# Patient Record
Sex: Female | Born: 1975 | Race: White | Hispanic: No | Marital: Married | Smoking: Current every day smoker
Health system: Southern US, Community
[De-identification: ages and names within clinical notes are randomized; demographics above are authoritative.]

## PROBLEM LIST (undated history)

## (undated) HISTORY — PX: HERNIA REPAIR: SHX51

---

## 2016-01-18 DIAGNOSIS — J209 Acute bronchitis, unspecified: Secondary | ICD-10-CM | POA: Diagnosis not present

## 2016-10-12 DIAGNOSIS — J40 Bronchitis, not specified as acute or chronic: Secondary | ICD-10-CM | POA: Diagnosis not present

## 2016-10-12 DIAGNOSIS — Z6828 Body mass index (BMI) 28.0-28.9, adult: Secondary | ICD-10-CM | POA: Diagnosis not present

## 2016-10-12 DIAGNOSIS — Z1389 Encounter for screening for other disorder: Secondary | ICD-10-CM | POA: Diagnosis not present

## 2016-11-15 DIAGNOSIS — W19XXXA Unspecified fall, initial encounter: Secondary | ICD-10-CM | POA: Diagnosis not present

## 2016-11-15 DIAGNOSIS — S01112A Laceration without foreign body of left eyelid and periocular area, initial encounter: Secondary | ICD-10-CM | POA: Diagnosis not present

## 2016-11-15 DIAGNOSIS — S0990XA Unspecified injury of head, initial encounter: Secondary | ICD-10-CM | POA: Diagnosis not present

## 2016-11-15 DIAGNOSIS — W1830XA Fall on same level, unspecified, initial encounter: Secondary | ICD-10-CM | POA: Diagnosis not present

## 2016-11-15 DIAGNOSIS — R040 Epistaxis: Secondary | ICD-10-CM | POA: Diagnosis not present

## 2017-10-04 DIAGNOSIS — F419 Anxiety disorder, unspecified: Secondary | ICD-10-CM | POA: Diagnosis not present

## 2017-10-04 DIAGNOSIS — Z6829 Body mass index (BMI) 29.0-29.9, adult: Secondary | ICD-10-CM | POA: Diagnosis not present

## 2017-10-04 DIAGNOSIS — F329 Major depressive disorder, single episode, unspecified: Secondary | ICD-10-CM | POA: Diagnosis not present

## 2018-03-26 DIAGNOSIS — Z6829 Body mass index (BMI) 29.0-29.9, adult: Secondary | ICD-10-CM | POA: Diagnosis not present

## 2018-03-26 DIAGNOSIS — J40 Bronchitis, not specified as acute or chronic: Secondary | ICD-10-CM | POA: Diagnosis not present

## 2018-03-26 DIAGNOSIS — J329 Chronic sinusitis, unspecified: Secondary | ICD-10-CM | POA: Diagnosis not present

## 2019-01-20 DIAGNOSIS — Z1389 Encounter for screening for other disorder: Secondary | ICD-10-CM | POA: Diagnosis not present

## 2019-01-20 DIAGNOSIS — Z124 Encounter for screening for malignant neoplasm of cervix: Secondary | ICD-10-CM | POA: Diagnosis not present

## 2019-01-20 DIAGNOSIS — Z1151 Encounter for screening for human papillomavirus (HPV): Secondary | ICD-10-CM | POA: Diagnosis not present

## 2019-01-20 DIAGNOSIS — E669 Obesity, unspecified: Secondary | ICD-10-CM | POA: Diagnosis not present

## 2019-01-20 DIAGNOSIS — Z1231 Encounter for screening mammogram for malignant neoplasm of breast: Secondary | ICD-10-CM | POA: Diagnosis not present

## 2019-01-20 DIAGNOSIS — Z130 Encounter for screening for diseases of the blood and blood-forming organs and certain disorders involving the immune mechanism: Secondary | ICD-10-CM | POA: Diagnosis not present

## 2019-01-20 DIAGNOSIS — Z113 Encounter for screening for infections with a predominantly sexual mode of transmission: Secondary | ICD-10-CM | POA: Diagnosis not present

## 2019-01-20 DIAGNOSIS — Z01419 Encounter for gynecological examination (general) (routine) without abnormal findings: Secondary | ICD-10-CM | POA: Diagnosis not present

## 2019-07-02 DIAGNOSIS — J40 Bronchitis, not specified as acute or chronic: Secondary | ICD-10-CM | POA: Diagnosis not present

## 2019-07-02 DIAGNOSIS — Z20828 Contact with and (suspected) exposure to other viral communicable diseases: Secondary | ICD-10-CM | POA: Diagnosis not present

## 2019-07-02 DIAGNOSIS — J329 Chronic sinusitis, unspecified: Secondary | ICD-10-CM | POA: Diagnosis not present

## 2020-02-02 DIAGNOSIS — Z1389 Encounter for screening for other disorder: Secondary | ICD-10-CM | POA: Diagnosis not present

## 2020-02-02 DIAGNOSIS — E669 Obesity, unspecified: Secondary | ICD-10-CM | POA: Diagnosis not present

## 2020-02-02 DIAGNOSIS — Z01419 Encounter for gynecological examination (general) (routine) without abnormal findings: Secondary | ICD-10-CM | POA: Diagnosis not present

## 2020-02-02 DIAGNOSIS — Z130 Encounter for screening for diseases of the blood and blood-forming organs and certain disorders involving the immune mechanism: Secondary | ICD-10-CM | POA: Diagnosis not present

## 2020-02-02 DIAGNOSIS — N9089 Other specified noninflammatory disorders of vulva and perineum: Secondary | ICD-10-CM | POA: Diagnosis not present

## 2020-02-11 DIAGNOSIS — Z13220 Encounter for screening for lipoid disorders: Secondary | ICD-10-CM | POA: Diagnosis not present

## 2020-02-11 DIAGNOSIS — Z0000 Encounter for general adult medical examination without abnormal findings: Secondary | ICD-10-CM | POA: Diagnosis not present

## 2020-02-11 DIAGNOSIS — Z1331 Encounter for screening for depression: Secondary | ICD-10-CM | POA: Diagnosis not present

## 2020-02-11 DIAGNOSIS — Z131 Encounter for screening for diabetes mellitus: Secondary | ICD-10-CM | POA: Diagnosis not present

## 2020-02-11 DIAGNOSIS — Z6831 Body mass index (BMI) 31.0-31.9, adult: Secondary | ICD-10-CM | POA: Diagnosis not present

## 2020-05-20 DIAGNOSIS — J449 Chronic obstructive pulmonary disease, unspecified: Secondary | ICD-10-CM | POA: Diagnosis not present

## 2020-05-20 DIAGNOSIS — Z20828 Contact with and (suspected) exposure to other viral communicable diseases: Secondary | ICD-10-CM | POA: Diagnosis not present

## 2020-05-24 DIAGNOSIS — R0602 Shortness of breath: Secondary | ICD-10-CM | POA: Diagnosis not present

## 2020-10-06 DIAGNOSIS — R1032 Left lower quadrant pain: Secondary | ICD-10-CM | POA: Diagnosis not present

## 2020-10-06 DIAGNOSIS — N939 Abnormal uterine and vaginal bleeding, unspecified: Secondary | ICD-10-CM | POA: Diagnosis not present

## 2020-10-06 DIAGNOSIS — R109 Unspecified abdominal pain: Secondary | ICD-10-CM | POA: Diagnosis not present

## 2020-10-06 DIAGNOSIS — T8389XA Other specified complication of genitourinary prosthetic devices, implants and grafts, initial encounter: Secondary | ICD-10-CM | POA: Diagnosis not present

## 2021-06-13 ENCOUNTER — Emergency Department (HOSPITAL_BASED_OUTPATIENT_CLINIC_OR_DEPARTMENT_OTHER): Payer: Self-pay

## 2021-06-13 ENCOUNTER — Encounter (HOSPITAL_BASED_OUTPATIENT_CLINIC_OR_DEPARTMENT_OTHER): Payer: Self-pay | Admitting: Urology

## 2021-06-13 ENCOUNTER — Inpatient Hospital Stay (HOSPITAL_BASED_OUTPATIENT_CLINIC_OR_DEPARTMENT_OTHER)
Admission: EM | Admit: 2021-06-13 | Discharge: 2021-06-22 | DRG: 853 | Disposition: A | Discharge status: Home / Self Care | Payer: Self-pay | Attending: Surgery | Admitting: Surgery

## 2021-06-13 ENCOUNTER — Other Ambulatory Visit: Payer: Self-pay

## 2021-06-13 DIAGNOSIS — X58XXXA Exposure to other specified factors, initial encounter: Secondary | ICD-10-CM | POA: Diagnosis present

## 2021-06-13 DIAGNOSIS — F17210 Nicotine dependence, cigarettes, uncomplicated: Secondary | ICD-10-CM | POA: Diagnosis present

## 2021-06-13 DIAGNOSIS — A419 Sepsis, unspecified organism: Principal | ICD-10-CM | POA: Diagnosis present

## 2021-06-13 DIAGNOSIS — K649 Unspecified hemorrhoids: Secondary | ICD-10-CM | POA: Diagnosis present

## 2021-06-13 DIAGNOSIS — K658 Other peritonitis: Secondary | ICD-10-CM | POA: Diagnosis present

## 2021-06-13 DIAGNOSIS — T184XXA Foreign body in colon, initial encounter: Secondary | ICD-10-CM | POA: Diagnosis present

## 2021-06-13 DIAGNOSIS — K621 Rectal polyp: Secondary | ICD-10-CM | POA: Diagnosis present

## 2021-06-13 DIAGNOSIS — K631 Perforation of intestine (nontraumatic): Secondary | ICD-10-CM | POA: Diagnosis present

## 2021-06-13 DIAGNOSIS — R030 Elevated blood-pressure reading, without diagnosis of hypertension: Secondary | ICD-10-CM | POA: Diagnosis not present

## 2021-06-13 DIAGNOSIS — Z20822 Contact with and (suspected) exposure to covid-19: Secondary | ICD-10-CM | POA: Diagnosis present

## 2021-06-13 DIAGNOSIS — K567 Ileus, unspecified: Secondary | ICD-10-CM | POA: Diagnosis not present

## 2021-06-13 DIAGNOSIS — Z933 Colostomy status: Secondary | ICD-10-CM

## 2021-06-13 LAB — URINALYSIS, ROUTINE W REFLEX MICROSCOPIC
Glucose, UA: NEGATIVE mg/dL
Ketones, ur: 80 mg/dL — AB
Leukocytes,Ua: NEGATIVE
Nitrite: NEGATIVE
Protein, ur: 100 mg/dL — AB
Specific Gravity, Urine: >1.03 — ABNORMAL HIGH (ref 1.005–1.030)
pH: 6 (ref 5.0–8.0)

## 2021-06-13 LAB — CBC WITH DIFFERENTIAL/PLATELET
Abs Immature Granulocytes: 0.13 10*3/uL — ABNORMAL HIGH (ref 0.00–0.07)
Basophils Absolute: 0.1 10*3/uL (ref 0.0–0.1)
Basophils Relative: 0 %
Eosinophils Absolute: 0 10*3/uL (ref 0.0–0.5)
Eosinophils Relative: 0 %
HCT: 47 % — ABNORMAL HIGH (ref 36.0–46.0)
Hemoglobin: 16.6 g/dL — ABNORMAL HIGH (ref 12.0–15.0)
Immature Granulocytes: 1 %
Lymphocytes Relative: 5 %
Lymphs Abs: 1.2 10*3/uL (ref 0.7–4.0)
MCH: 33.4 pg (ref 26.0–34.0)
MCHC: 35.3 g/dL (ref 30.0–36.0)
MCV: 94.6 fL (ref 80.0–100.0)
Monocytes Absolute: 1.3 10*3/uL — ABNORMAL HIGH (ref 0.1–1.0)
Monocytes Relative: 5 %
Neutro Abs: 20.9 10*3/uL — ABNORMAL HIGH (ref 1.7–7.7)
Neutrophils Relative %: 89 %
Platelets: 272 10*3/uL (ref 150–400)
RBC: 4.97 MIL/uL (ref 3.87–5.11)
RDW: 13.2 % (ref 11.5–15.5)
WBC: 23.6 10*3/uL — ABNORMAL HIGH (ref 4.0–10.5)
nRBC: 0 % (ref 0.0–0.2)

## 2021-06-13 LAB — COMPREHENSIVE METABOLIC PANEL
ALT: 13 U/L (ref 0–44)
AST: 16 U/L (ref 15–41)
Albumin: 3.6 g/dL (ref 3.5–5.0)
Alkaline Phosphatase: 118 U/L (ref 38–126)
Anion gap: 16 — ABNORMAL HIGH (ref 5–15)
BUN: 16 mg/dL (ref 6–20)
CO2: 23 mmol/L (ref 22–32)
Calcium: 9.2 mg/dL (ref 8.9–10.3)
Chloride: 92 mmol/L — ABNORMAL LOW (ref 98–111)
Creatinine, Ser: 0.79 mg/dL (ref 0.44–1.00)
GFR, Estimated: >60 mL/min (ref >60)
Glucose, Bld: 121 mg/dL — ABNORMAL HIGH (ref 70–99)
Potassium: 3 mmol/L — ABNORMAL LOW (ref 3.5–5.1)
Sodium: 131 mmol/L — ABNORMAL LOW (ref 135–145)
Total Bilirubin: 1.2 mg/dL (ref 0.3–1.2)
Total Protein: 8.4 g/dL — ABNORMAL HIGH (ref 6.5–8.1)

## 2021-06-13 LAB — PROTIME-INR
INR: 1.1 (ref 0.8–1.2)
Prothrombin Time: 13.9 seconds (ref 11.4–15.2)

## 2021-06-13 LAB — RESP PANEL BY RT-PCR (FLU A&B, COVID) ARPGX2
Influenza A by PCR: NEGATIVE
Influenza B by PCR: NEGATIVE
SARS Coronavirus 2 by RT PCR: NEGATIVE

## 2021-06-13 LAB — URINALYSIS, MICROSCOPIC (REFLEX)

## 2021-06-13 LAB — LACTIC ACID, PLASMA: Lactic Acid, Venous: 1.5 mmol/L (ref 0.5–1.9)

## 2021-06-13 LAB — APTT: aPTT: 25 seconds (ref 24–36)

## 2021-06-13 LAB — PREGNANCY, URINE: Preg Test, Ur: NEGATIVE

## 2021-06-13 MED ORDER — ONDANSETRON HCL 4 MG/2ML IJ SOLN: 4.0000 mg | Freq: Four times a day (QID) | INTRAMUSCULAR | Status: DC | PRN

## 2021-06-13 MED ORDER — PIPERACILLIN-TAZOBACTAM 3.375 G IVPB
3.3750 g | Freq: Three times a day (TID) | INTRAVENOUS | Status: DC
  Administered 2021-06-14 (×2): 3.375 g via INTRAVENOUS
  Filled 2021-06-13 (×4): qty 50

## 2021-06-13 MED ORDER — ACETAMINOPHEN 500 MG PO TABS
1000.0000 mg | ORAL_TABLET | Freq: Once | ORAL | Status: AC
  Administered 2021-06-13: 1000 mg via ORAL
  Filled 2021-06-13: qty 2

## 2021-06-13 MED ORDER — LACTATED RINGERS IV BOLUS (SEPSIS)
1000.0000 mL | Freq: Once | INTRAVENOUS | Status: AC
  Administered 2021-06-13: 1000 mL via INTRAVENOUS

## 2021-06-13 MED ORDER — IOHEXOL 300 MG/ML  SOLN
100.0000 mL | Freq: Once | INTRAMUSCULAR | Status: AC | PRN
  Administered 2021-06-13: 100 mL via INTRAVENOUS

## 2021-06-13 MED ORDER — METRONIDAZOLE 500 MG/100ML IV SOLN
500.0000 mg | Freq: Once | INTRAVENOUS | Status: AC
  Administered 2021-06-13: 500 mg via INTRAVENOUS
  Filled 2021-06-13: qty 100

## 2021-06-13 MED ORDER — ACETAMINOPHEN 325 MG PO TABS
650.0000 mg | ORAL_TABLET | Freq: Four times a day (QID) | ORAL | Status: DC | PRN
  Administered 2021-06-13: 650 mg via ORAL
  Filled 2021-06-13: qty 2

## 2021-06-13 MED ORDER — SODIUM CHLORIDE 0.9 % IV SOLN
2.0000 g | Freq: Once | INTRAVENOUS | Status: AC
  Administered 2021-06-13: 2 g via INTRAVENOUS
  Filled 2021-06-13: qty 20

## 2021-06-13 MED ORDER — POTASSIUM CHLORIDE IN NACL 20-0.9 MEQ/L-% IV SOLN
INTRAVENOUS | Status: DC
  Filled 2021-06-13 (×3): qty 1000

## 2021-06-13 MED ORDER — PIPERACILLIN-TAZOBACTAM 3.375 G IVPB: 3.3750 g | Freq: Three times a day (TID) | INTRAVENOUS | Status: DC

## 2021-06-13 MED ORDER — HYDROMORPHONE HCL 1 MG/ML IJ SOLN: 1.0000 mg | INTRAMUSCULAR | Status: DC | PRN

## 2021-06-13 MED ORDER — ENOXAPARIN SODIUM 40 MG/0.4ML IJ SOSY: 40.0000 mg | PREFILLED_SYRINGE | INTRAMUSCULAR | Status: DC

## 2021-06-13 MED ORDER — ONDANSETRON 4 MG PO TBDP: 4.0000 mg | ORAL_TABLET | Freq: Four times a day (QID) | ORAL | Status: DC | PRN

## 2021-06-13 NOTE — ED Provider Notes (Signed)
[facility]ERGENC[facility] DEPARTMENT Provider Note   CSN: [id number] Arrival date & time: 06/13/21  1646     History  Chief Complaint  Patient presents with   Abdominal Pain    Misty Peck is a 46 y.o. female.  Patient here with right lower abdominal pain with dark urine/dysuria, chills, body aches, fever.  She has been having some lower abdominal pain for the last several days but fever and chills since yesterday.  Noticed that urine was darker.  She denies any vaginal discharge.  She has had some nausea but no vomiting.  She has had bilateral hernia repair as well as C-section in the past.  Does not take any blood thinners.  The history is provided by the patient.  Abdominal Pain Pain location:  RLQ Pain quality: aching   Pain radiates to:  Does not radiate Pain severity:  Moderate Onset quality:  Gradual Duration:  5 days Timing:  Constant Progression:  Worsening Chronicity:  New Context: previous surgery   Relieved by:  Nothing Worsened by:  Nothing Ineffective treatments:  None tried Associated symptoms: chills, dysuria, fever and nausea   Risk factors: multiple surgeries       Home Medications Prior to Admission medications   Not on File      Allergies    Patient has no known allergies.    Review of Systems   Review of Systems  Constitutional:  Positive for chills and fever.  Gastrointestinal:  Positive for abdominal pain and nausea.  Genitourinary:  Positive for dysuria.   Physical Exam Updated Vital Signs BP (!) 151/101    Pulse (!) 108    Temp 99.2 F (37.3 C) (Oral)    Resp 20    Ht 5' (1.524 m)    Wt 72.6 kg    SpO2 94%    BMI 31.25 kg/m  Physical Exam Vitals and nursing note reviewed.  Constitutional:      General: She is not in acute distress.    Appearance: She is well-developed. She is not ill-appearing.  HENT:     Head: Normocephalic and atraumatic.     Mouth/Throat:     Mouth: Mucous membranes are moist.  Eyes:      Extraocular Movements: Extraocular movements intact.     Conjunctiva/sclera: Conjunctivae normal.  Cardiovascular:     Rate and Rhythm: Regular rhythm. Tachycardia present.     Heart sounds: Normal heart sounds. No murmur heard. Pulmonary:     Effort: Pulmonary effort is normal. No respiratory distress.     Breath sounds: Normal breath sounds.  Abdominal:     Palpations: Abdomen is soft.     Tenderness: There is abdominal tenderness in the right lower quadrant.     Hernia: No hernia is present.  Musculoskeletal:        General: No swelling.     Cervical back: Neck supple.  Skin:    General: Skin is warm and dry.     Capillary Refill: Capillary refill takes less than 2 seconds.  Neurological:     General: No focal deficit present.     Mental Status: She is alert.  Psychiatric:        Mood and Affect: Mood normal.    ED Results / Procedures / Treatments   Labs (all labs ordered are listed, but only abnormal results are displayed) Labs Reviewed  URINALYSIS, ROUTINE W REFLEX MICROSCOPIC - Abnormal; Notable for the following components:      Result Value  Color, Urine AMBER (*)    APPearance CLOUDY (*)    Specific Gravity, Urine >1.030 (*)    Hgb urine dipstick SMALL (*)    Bilirubin Urine MODERATE (*)    Ketones, ur 80 (*)    Protein, ur 100 (*)    All other components within normal limits  COMPREHENSIVE METABOLIC PANEL - Abnormal; Notable for the following components:   Sodium 131 (*)    Potassium 3.0 (*)    Chloride 92 (*)    Glucose, Bld 121 (*)    Total Protein 8.4 (*)    Anion gap 16 (*)    All other components within normal limits  CBC WITH DIFFERENTIAL/PLATELET - Abnormal; Notable for the following components:   WBC 23.6 (*)    Hemoglobin 16.6 (*)    HCT 47.0 (*)    Neutro Abs 20.9 (*)    Monocytes Absolute 1.3 (*)    Abs Immature Granulocytes 0.13 (*)    All other components within normal limits  URINALYSIS, MICROSCOPIC (REFLEX) - Abnormal; Notable for the  following components:   Bacteria, UA MANY (*)    All other components within normal limits  RESP PANEL BY RT-PCR (FLU A&B, COVID) ARPGX2  CULTURE, BLOOD (ROUTINE X 2)  CULTURE, BLOOD (ROUTINE X 2)  URINE CULTURE  LACTIC ACID, PLASMA  PROTIME-INR  APTT  PREGNANCY, URINE  LACTIC ACID, PLASMA    EKG EKG Interpretation  Date/Time:  Monday June 13 2021 17:33:59 EST Ventricular Rate:  122 PR Interval:  156 QRS Duration: 94 QT Interval:  319 QTC Calculation: 455 R Axis:   -72 Text Interpretation: Sinus tachycardia LAE, consider biatrial enlargement Abnormal R-wave progression, late transition Left ventricular hypertrophy Inferior infarct, old Confirmed by Lennice Sites [phone]) on 06/13/2021 5:37:24 PM  Radiology CT ABDOMEN PELVIS W CONTRAST  Result Date: 06/13/2021 CLINICAL DATA:  Bilateral lower quadrant pain and fever. Patient reports right-sided pelvic pain since Friday. EXAM: CT ABDOMEN AND PELVIS WITH CONTRAST TECHNIQUE: Multidetector CT imaging of the abdomen and pelvis was performed using the standard protocol following bolus administration of intravenous contrast. RADIATION DOSE REDUCTION: This exam was performed according to the departmental dose-optimization program which includes automated exposure control, adjustment of the mA and/or kV according to patient size and/or use of iterative reconstruction technique. CONTRAST:  185mL OMNIPAQUE IOHEXOL 300 MG/ML  SOLN COMPARISON:  None. FINDINGS: Lower chest: No acute airspace disease or pleural effusion. Hepatobiliary: No focal liver abnormality is seen. Possible but not definite gallstone. No pericholecystic fat stranding or inflammation. No biliary dilatation. Pancreas: No ductal dilatation or inflammation. Spleen: Normal in size without focal abnormality. Small cleft posteriorly. Adrenals/Urinary Tract: No adrenal nodule. No hydronephrosis or perinephric edema. Homogeneous renal enhancement with symmetric excretion on delayed phase  imaging. No renal calculi or focal renal lesion. Urinary bladder is near completely empty. Stomach/Bowel: There is a linear metallic density in the sigmoid colon. This spans approximately 2.9 cm and is hooked at the end. There is associated sigmoid bowel perforation with mottled extraluminal air, colonic wall thickening, and fat stranding. Small amount of non organized trace fluid. This is in the pelvis just to the left of midline. Free air tracks into the left upper quadrant. No drainable fluid collection. The appendix is visualized and is normal. Small volume of colonic stool. Occasional fluid-filled loops of small bowel, mildly edematous in the pelvis, likely reactive. No small bowel obstruction. Stomach is decompressed. Vascular/Lymphatic: Aortic atherosclerosis. No aortic aneurysm. There is no portal venous or mesenteric  gas. Scattered prominent mesenteric lymph nodes, typically reactive. Few prominent inguinal nodes. Reproductive: T-shaped IUD in the uterus.  No adnexal mass. Other: Mild free air in the pelvis with small amount of non organized free fluid. Patchy soft tissue gas tracks into the left upper abdomen. No drainable collection. Musculoskeletal: There are no acute or suspicious osseous abnormalities. IMPRESSION: 1. Linear metallic density in the sigmoid colon spanning approximately 2.9 cm and hooked at the end, consistent with foreign body. There is associated sigmoid bowel perforation with mottled extraluminal air, colonic wall thickening, none organized trace fluid and fat stranding. Free air tracks into the left upper quadrant of the abdomen. No drainable fluid collection. 2. Possible but not definite gallstone. No evidence of acute cholecystitis. Aortic Atherosclerosis (ICD10-I70.0). Critical Value/emergent results were called by telephone at the time of interpretation on 06/13/2021 at 6:31 pm to provider Flavius Repsher , who verbally acknowledged these results. Electronically Signed   By: Narda Rutherford M.D.   On: 06/13/2021 18:32   DG Chest Port 1 View  Result Date: 06/13/2021 CLINICAL DATA:  Right-sided pelvic pain dark urine EXAM: PORTABLE CHEST 1 VIEW COMPARISON:  None. FINDINGS: The heart size and mediastinal contours are within normal limits. Both lungs are clear. The visualized skeletal structures are unremarkable. IMPRESSION: No active disease. Electronically Signed   By: Jasmine Pang M.D.   On: 06/13/2021 17:37    Procedures .Critical Care Performed by: Virgina Norfolk, DO Authorized by: Virgina Norfolk, DO   Critical care provider statement:    Critical care time (minutes):  40   Critical care was necessary to treat or prevent imminent or life-threatening deterioration of the following conditions:  Sepsis   Critical care was time spent personally by me on the following activities:  Blood draw for specimens, development of treatment plan with patient or surrogate, discussions with consultants, evaluation of patient's response to treatment, examination of patient, obtaining history from patient or surrogate, ordering and performing treatments and interventions, ordering and review of laboratory studies, ordering and review of radiographic studies, pulse oximetry and review of old charts   Care discussed with: admitting provider      Medications Ordered in ED Medications  lactated ringers bolus 1,000 mL (1,000 mLs Intravenous New Bag/Given 06/13/21 1731)  cefTRIAXone (ROCEPHIN) 2 g in sodium chloride 0.9 % 100 mL IVPB (0 g Intravenous Stopped 06/13/21 1834)  metroNIDAZOLE (FLAGYL) IVPB 500 mg (500 mg Intravenous New Bag/Given 06/13/21 1733)  acetaminophen (TYLENOL) tablet 1,000 mg (1,000 mg Oral Given 06/13/21 1725)  iohexol (OMNIPAQUE) 300 MG/ML solution 100 mL (100 mLs Intravenous Contrast Given 06/13/21 1757)    ED Course/ Medical Decision Making/ A&P                           Medical Decision Making Amount and/or Complexity of Data Reviewed Labs: ordered. Radiology:  ordered. ECG/medicine tests: ordered.  Risk OTC drugs. Prescription drug management. Decision regarding hospitalization.   Latoi D Aseltine is here with abdominal pain.  Patient arrives febrile and tachycardic.  She has been having mostly right lower abdominal pain on and off for the last several days.  Now with some fevers and chills since last night.  Has felt nauseous at times.  Urine has been darker.  Denies history of kidney stones.  Comorbidities that are important to think about her are that patient has had abdominal surgery including C-section and hernia repair in the past.  She denies any diarrhea.  She is tender mostly in the right lower quadrant on exam and may be suprapubic area.  She has maybe had some left flank pain also.  Denies any obvious vaginal discharge or bleeding.  Has an IUD.  Differential diagnosis includes appendicitis versus bowel obstruction versus cystitis versus kidney stones versus possibly ovarian pathology less PID.  Given the patient's tachycardia and fever, sepsis work-up has been initiated.  We will give a dose IV Flagyl and Rocephin to broadly cover for intra-abdominal process/UTI.  We will get CBC, CMP, lipase, lactic acid, urinalysis, chest x-ray, CT scan abdomen and pelvis.  Will give IV fluids, IV antibiotics, will give Tylenol.    645 pm lab work upon my interpretation and review show leukocytosis of 23.6.  Lactic acid however is normal.  No other significant electrolyte abnormality or kidney injury.  COVID and flu test are negative.  Radiologist called me on the phone concerning CT abdomen and pelvis and we talked on the phone.  States that there is a linear metallic density within the sigmoid colon about 2.9 cm in height at the end consistent with a foreign body.  There is associated sigmoid bowel perforation with mottled extraluminal air and colonic wall thickening but no organized fluid or fat stranding.  Free air tracks into the left upper quadrant of the  abdomen.  Upon reevaluation of the patient she does admit that she lost her partial denture and overall suspect that she swallowed this accidentally causing this infectious process today.  We will talk with general surgery.  She is hemodynamically stable.  Tachycardia has improved.  Fever has improved.  650 pm talked with Dr. Ninfa Linden with general surgery on the phone who states to transfer patient to [facility] long ED for evaluation by him.  I have made Dr. Melina Copa with the emergency medicine team aware of transfer.  This chart was dictated using voice recognition software.  Despite best efforts to proofread,  errors can occur which can change the documentation meaning.         Final Clinical Impression(s) / ED Diagnoses Final diagnoses:  Sepsis, due to unspecified organism, unspecified whether acute organ dysfunction present [facility])  Perforated sigmoid colon ([place])  Foreign body in colon, initial encounter    Rx / DC Orders ED Discharge Orders     None         Lennice Sites, DO 06/13/21 1849

## 2021-06-13 NOTE — ED Provider Notes (Signed)
Pt transferred from [facility] for further eval by gen surg. CT scan with findings concerning for bowel perforation.   Physical Exam  BP (!) 147/95    Pulse (!) 102    Temp 99.2 F (37.3 C) (Oral)    Resp (!) 25    Ht 5' (1.524 m)    Wt 72.6 kg    SpO2 97%    BMI 31.25 kg/m   Physical Exam Vitals and nursing note reviewed.  Constitutional:      Appearance: She is not ill-appearing.  HENT:     Head: Normocephalic and atraumatic.  Eyes:     Conjunctiva/sclera: Conjunctivae normal.  Cardiovascular:     Rate and Rhythm: Normal rate and regular rhythm.  Pulmonary:     Effort: Pulmonary effort is normal.     Breath sounds: Normal breath sounds.  Skin:    General: Skin is warm and dry.     Coloration: Skin is not jaundiced.  Neurological:     Mental Status: She is alert.    Procedures  Procedures  ED Course / MDM    Medical Decision Making Amount and/or Complexity of Data Reviewed Labs: ordered. Radiology: ordered. ECG/medicine tests: ordered. Discussion of management or test interpretation with external provider(s): Dr. Magnus Ivan consulted. Aware pt is here in the ED. Will admit.   Risk OTC drugs. Prescription drug management. Decision regarding hospitalization.          Tanda Rockers, PA-C 06/13/21 2128    Terrilee Files, MD 06/14/21 [phone]

## 2021-06-13 NOTE — ED Triage Notes (Signed)
Intermittent Right sided pelvic pain since Friday Reports dark urine Denies Nausea/vomiting Took laxative for constipation, normal BM

## 2021-06-13 NOTE — ED Notes (Signed)
Patient transported to CT 

## 2021-06-13 NOTE — ED Notes (Signed)
[facility] at bedside 

## 2021-06-13 NOTE — ED Notes (Signed)
Patient returned from CT

## 2021-06-13 NOTE — ED Notes (Signed)
[facility] states they will arrive in ~5 minutes.

## 2021-06-13 NOTE — H&P (Signed)
CC: abdominal pain  HPI: Misty Peck is an 46 y.o. female who is transferred from [place].  She presented with a several day history of lower abdominal pain which she described as intermittent and cramping.  She also had some chills and fevers.  She had taken laxatives and had a bowel movement on Saturday.  She was found to have an elevated white blood count on laboratory data.  She then underwent a CT scan of the abdomen pelvis showing a foreign body in the sigmoid colon with focal perforation.  After discussing the CAT scan findings with the patient, she admitted that she likely swallowed her partial dentures over a week ago.  She just started having abdominal discomfort on Friday or Saturday.  She reports her pain again is intermittent and is now currently a 2 out of 10.  She reports having had dark urine.  History reviewed. No pertinent past medical history.  Past Surgical History:  Procedure Laterality Date   CESAREAN SECTION     HERNIA REPAIR      History reviewed. No pertinent family history.  Social:  reports that she has been smoking cigarettes. She has been smoking an average of 1 pack per day. She has never been exposed to tobacco smoke. She has never used smokeless tobacco. She reports current alcohol use. She reports that she does not use drugs.  Allergies: No Known Allergies  Medications: I have reviewed the patient's current medications.  Results for orders placed or performed during the hospital encounter of 06/13/21 (from the past 48 hour(s))  Resp Panel by RT-PCR (Flu A&B, Covid) Nasopharyngeal Swab     Status: None   Collection Time: 06/13/21  4:59 PM   Specimen: Nasopharyngeal Swab; Nasopharyngeal(NP) swabs in vial transport medium  Result Value Ref Range   SARS Coronavirus 2 by RT PCR NEGATIVE NEGATIVE    Comment: (NOTE) SARS-CoV-2 target nucleic acids are NOT DETECTED.  The SARS-CoV-2 RNA is generally detectable in upper respiratory specimens  during the acute phase of infection. The lowest concentration of SARS-CoV-2 viral copies this assay can detect is 138 copies/mL. A negative result does not preclude SARS-Cov-2 infection and should not be used as the sole basis for treatment or other patient management decisions. A negative result may occur with  improper specimen collection/handling, submission of specimen other than nasopharyngeal swab, presence of viral mutation(s) within the areas targeted by this assay, and inadequate number of viral copies(<138 copies/mL). A negative result must be combined with clinical observations, patient history, and epidemiological information. The expected result is Negative.  Fact Sheet for Patients:  EntrepreneurPulse.com.au  Fact Sheet for Healthcare Providers:  IncredibleEmployment.be  This test is no t yet approved or cleared by the [place] FDA and  has been authorized for detection and/or diagnosis of SARS-CoV-2 by FDA under an Emergency Use Authorization (EUA). This EUA will remain  in effect (meaning this test can be used) for the duration of the COVID-19 declaration under Section 564(b)(1) of the Act, 21 U.S.C.section 360bbb-3(b)(1), unless the authorization is terminated  or revoked sooner.       Influenza A by PCR NEGATIVE NEGATIVE   Influenza B by PCR NEGATIVE NEGATIVE    Comment: (NOTE) The Xpert Xpress SARS-CoV-2/FLU/RSV plus assay is intended as an aid in the diagnosis of influenza from Nasopharyngeal swab specimens and should not be used as a sole basis for treatment. Nasal washings and aspirates are unacceptable for Xpert Xpress SARS-CoV-2/FLU/RSV testing.  Fact  Sheet for Patients: EntrepreneurPulse.com.au  Fact Sheet for Healthcare Providers: IncredibleEmployment.be  This test is not yet approved or cleared by the [place] FDA and has been authorized for detection and/or diagnosis  of SARS-CoV-2 by FDA under an Emergency Use Authorization (EUA). This EUA will remain in effect (meaning this test can be used) for the duration of the COVID-19 declaration under Section 564(b)(1) of the Act, 21 U.S.C. section 360bbb-3(b)(1), unless the authorization is terminated or revoked.  Performed at [facility], [address] [place], [place] [address]   Urinalysis, Routine w reflex microscopic Urine, Clean Catch     Status: Abnormal   Collection Time: 06/13/21  4:59 PM  Result Value Ref Range   Color, Urine AMBER (A) YELLOW    Comment: BIOCHEMICALS MAY BE AFFECTED BY COLOR   APPearance CLOUDY (A) CLEAR   Specific Gravity, Urine >1.030 (H) 1.005 - 1.030   pH 6.0 5.0 - 8.0   Glucose, UA NEGATIVE NEGATIVE mg/dL   Hgb urine dipstick SMALL (A) NEGATIVE   Bilirubin Urine MODERATE (A) NEGATIVE   Ketones, ur 80 (A) NEGATIVE mg/dL   Protein, ur 100 (A) NEGATIVE mg/dL   Nitrite NEGATIVE NEGATIVE   Leukocytes,Ua NEGATIVE NEGATIVE    Comment: Performed at [facility], [address] [place], [place] [address]  Urinalysis, Microscopic (reflex)     Status: Abnormal   Collection Time: 06/13/21  4:59 PM  Result Value Ref Range   RBC / HPF 0-5 0 - 5 RBC/hpf   WBC, UA 0-5 0 - 5 WBC/hpf   Bacteria, UA MANY (A) NONE SEEN   Squamous Epithelial / LPF 0-5 0 - 5   Mucus PRESENT     Comment: Performed at [facility], [address] [place], [place] [address]  Lactic acid, plasma     Status: None   Collection Time: 06/13/21  5:00 PM  Result Value Ref Range   Lactic Acid, Venous 1.5 0.5 - 1.9 mmol/L    Comment: Performed at [facility], [address] [place], [place] [address]  Comprehensive metabolic panel     Status: Abnormal   Collection Time: 06/13/21  5:00 PM  Result Value Ref Range   Sodium 131 (L) 135 - 145 mmol/L   Potassium 3.0 (L) 3.5 - 5.1 mmol/L   Chloride 92 (L) 98 - 111 mmol/L   CO2 23 22 - 32 mmol/L   Glucose, Bld 121  (H) 70 - 99 mg/dL    Comment: Glucose reference range applies only to samples taken after fasting for at least 8 hours.   BUN 16 6 - 20 mg/dL   Creatinine, Ser 0.79 0.44 - 1.00 mg/dL   Calcium 9.2 8.9 - 10.3 mg/dL   Total Protein 8.4 (H) 6.5 - 8.1 g/dL   Albumin 3.6 3.5 - 5.0 g/dL   AST 16 15 - 41 U/L   ALT 13 0 - 44 U/L   Alkaline Phosphatase 118 38 - 126 U/L   Total Bilirubin 1.2 0.3 - 1.2 mg/dL   GFR, Estimated >60 >60 mL/min    Comment: (NOTE) Calculated using the CKD-EPI Creatinine Equation (2021)    Anion gap 16 (H) 5 - 15    Comment: Performed at [facility], [address] [place], [place] [address]  CBC WITH DIFFERENTIAL     Status: Abnormal   Collection Time: 06/13/21  5:00 PM  Result Value Ref Range  WBC 23.6 (H) 4.0 - 10.5 K/uL   RBC 4.97 3.87 - 5.11 MIL/uL   Hemoglobin 16.6 (H) 12.0 - 15.0 g/dL   HCT 47.0 (H) 36.0 - 46.0 %   MCV 94.6 80.0 - 100.0 fL   MCH 33.4 26.0 - 34.0 pg   MCHC 35.3 30.0 - 36.0 g/dL   RDW 13.2 11.5 - 15.5 %   Platelets 272 150 - 400 K/uL   nRBC 0.0 0.0 - 0.2 %   Neutrophils Relative % 89 %   Neutro Abs 20.9 (H) 1.7 - 7.7 K/uL   Lymphocytes Relative 5 %   Lymphs Abs 1.2 0.7 - 4.0 K/uL   Monocytes Relative 5 %   Monocytes Absolute 1.3 (H) 0.1 - 1.0 K/uL   Eosinophils Relative 0 %   Eosinophils Absolute 0.0 0.0 - 0.5 K/uL   Basophils Relative 0 %   Basophils Absolute 0.1 0.0 - 0.1 K/uL   Immature Granulocytes 1 %   Abs Immature Granulocytes 0.13 (H) 0.00 - 0.07 K/uL    Comment: Performed at [facility], [address] [place], [place] [address]  Protime-INR     Status: None   Collection Time: 06/13/21  5:00 PM  Result Value Ref Range   Prothrombin Time 13.9 11.4 - 15.2 seconds   INR 1.1 0.8 - 1.2    Comment: (NOTE) INR goal varies based on device and disease states. Performed at [facility], [address] [place], [place] [address]   APTT     Status: None   Collection Time: 06/13/21  5:00 PM   Result Value Ref Range   aPTT 25 24 - 36 seconds    Comment: Performed at [facility], [address] [place], [place] [address]  Pregnancy, urine     Status: None   Collection Time: 06/13/21  5:01 PM  Result Value Ref Range   Preg Test, Ur NEGATIVE NEGATIVE    Comment:        THE SENSITIVITY OF THIS METHODOLOGY IS >20 mIU/mL. Performed at [facility], [address] [place], [place] [address]     CT ABDOMEN PELVIS W CONTRAST  Result Date: 06/13/2021 CLINICAL DATA:  Bilateral lower quadrant pain and fever. Patient reports right-sided pelvic pain since Friday. EXAM: CT ABDOMEN AND PELVIS WITH CONTRAST TECHNIQUE: Multidetector CT imaging of the abdomen and pelvis was performed using the standard protocol following bolus administration of intravenous contrast. RADIATION DOSE REDUCTION: This exam was performed according to the departmental dose-optimization program which includes automated exposure control, adjustment of the mA and/or kV according to patient size and/or use of iterative reconstruction technique. CONTRAST:  161mL OMNIPAQUE IOHEXOL 300 MG/ML  SOLN COMPARISON:  None. FINDINGS: Lower chest: No acute airspace disease or pleural effusion. Hepatobiliary: No focal liver abnormality is seen. Possible but not definite gallstone. No pericholecystic fat stranding or inflammation. No biliary dilatation. Pancreas: No ductal dilatation or inflammation. Spleen: Normal in size without focal abnormality. Small cleft posteriorly. Adrenals/Urinary Tract: No adrenal nodule. No hydronephrosis or perinephric edema. Homogeneous renal enhancement with symmetric excretion on delayed phase imaging. No renal calculi or focal renal lesion. Urinary bladder is near completely empty. Stomach/Bowel: There is a linear metallic density in the sigmoid colon. This spans approximately 2.9 cm and is hooked at the end. There is associated sigmoid bowel perforation with mottled extraluminal air,  colonic wall thickening, and fat stranding. Small amount of non organized trace fluid. This  is in the pelvis just to the left of midline. Free air tracks into the left upper quadrant. No drainable fluid collection. The appendix is visualized and is normal. Small volume of colonic stool. Occasional fluid-filled loops of small bowel, mildly edematous in the pelvis, likely reactive. No small bowel obstruction. Stomach is decompressed. Vascular/Lymphatic: Aortic atherosclerosis. No aortic aneurysm. There is no portal venous or mesenteric gas. Scattered prominent mesenteric lymph nodes, typically reactive. Few prominent inguinal nodes. Reproductive: T-shaped IUD in the uterus.  No adnexal mass. Other: Mild free air in the pelvis with small amount of non organized free fluid. Patchy soft tissue gas tracks into the left upper abdomen. No drainable collection. Musculoskeletal: There are no acute or suspicious osseous abnormalities. IMPRESSION: 1. Linear metallic density in the sigmoid colon spanning approximately 2.9 cm and hooked at the end, consistent with foreign body. There is associated sigmoid bowel perforation with mottled extraluminal air, colonic wall thickening, none organized trace fluid and fat stranding. Free air tracks into the left upper quadrant of the abdomen. No drainable fluid collection. 2. Possible but not definite gallstone. No evidence of acute cholecystitis. Aortic Atherosclerosis (ICD10-I70.0). Critical Value/emergent results were called by telephone at the time of interpretation on 06/13/2021 at 6:31 pm to provider ADAM CURATOLO , who verbally acknowledged these results. Electronically Signed   By: Keith Rake M.D.   On: 06/13/2021 18:32   DG Chest Port 1 View  Result Date: 06/13/2021 CLINICAL DATA:  Right-sided pelvic pain dark urine EXAM: PORTABLE CHEST 1 VIEW COMPARISON:  None. FINDINGS: The heart size and mediastinal contours are within normal limits. Both lungs are clear. The  visualized skeletal structures are unremarkable. IMPRESSION: No active disease. Electronically Signed   By: Donavan Foil M.D.   On: 06/13/2021 17:37    ROS - all of the below systems have been reviewed with the patient and positives are indicated with bold text General: chills, fever or night sweats Eyes: blurry vision or double vision ENT: epistaxis or sore throat Allergy/Immunology: itchy/watery eyes or nasal congestion Hematologic/Lymphatic: bleeding problems, blood clots or swollen lymph nodes Endocrine: temperature intolerance or unexpected weight changes Breast: new or changing breast lumps or nipple discharge Resp: cough, shortness of breath, or wheezing CV: chest pain or dyspnea on exertion GI: as per HPI GU: dysuria, trouble voiding, or hematuria MSK: joint pain or joint stiffness Neuro: TIA or stroke symptoms Derm: pruritus and skin lesion changes Psych: anxiety and depression  PE Blood pressure (!) 150/101, pulse (!) 101, temperature 99.2 F (37.3 C), temperature source Oral, resp. rate (!) 26, height 5' (1.524 m), weight 72.6 kg, SpO2 96 %. Constitutional: NAD; conversant; no deformities Eyes: Moist conjunctiva; no lid lag; anicteric; PERRL Neck: Trachea midline; no thyromegaly Lungs: Normal respiratory effort; no tactile fremitus CV: RRR; no palpable thrills; no pitting edema GI: Abd soft and nondistended with only focal tenderness and guarding which is moderate in the left lower quadrant.  She does not have frank peritonitis; no palpable hepatosplenomegaly MSK: Normal range of motion of extremities; no clubbing/cyanosis Psychiatric: Appropriate affect; alert and oriented x3 Lymphatic: No palpable cervical or axillary lymphadenopathy  Results for orders placed or performed during the hospital encounter of 06/13/21 (from the past 48 hour(s))  Resp Panel by RT-PCR (Flu A&B, Covid) Nasopharyngeal Swab     Status: None   Collection Time: 06/13/21  4:59 PM   Specimen:  Nasopharyngeal Swab; Nasopharyngeal(NP) swabs in vial transport medium  Result Value Ref Range   SARS Coronavirus  2 by RT PCR NEGATIVE NEGATIVE    Comment: (NOTE) SARS-CoV-2 target nucleic acids are NOT DETECTED.  The SARS-CoV-2 RNA is generally detectable in upper respiratory specimens during the acute phase of infection. The lowest concentration of SARS-CoV-2 viral copies this assay can detect is 138 copies/mL. A negative result does not preclude SARS-Cov-2 infection and should not be used as the sole basis for treatment or other patient management decisions. A negative result may occur with  improper specimen collection/handling, submission of specimen other than nasopharyngeal swab, presence of viral mutation(s) within the areas targeted by this assay, and inadequate number of viral copies(<138 copies/mL). A negative result must be combined with clinical observations, patient history, and epidemiological information. The expected result is Negative.  Fact Sheet for Patients:  EntrepreneurPulse.com.au  Fact Sheet for Healthcare Providers:  IncredibleEmployment.be  This test is no t yet approved or cleared by the [place] FDA and  has been authorized for detection and/or diagnosis of SARS-CoV-2 by FDA under an Emergency Use Authorization (EUA). This EUA will remain  in effect (meaning this test can be used) for the duration of the COVID-19 declaration under Section 564(b)(1) of the Act, 21 U.S.C.section 360bbb-3(b)(1), unless the authorization is terminated  or revoked sooner.       Influenza A by PCR NEGATIVE NEGATIVE   Influenza B by PCR NEGATIVE NEGATIVE    Comment: (NOTE) The Xpert Xpress SARS-CoV-2/FLU/RSV plus assay is intended as an aid in the diagnosis of influenza from Nasopharyngeal swab specimens and should not be used as a sole basis for treatment. Nasal washings and aspirates are unacceptable for Xpert Xpress  SARS-CoV-2/FLU/RSV testing.  Fact Sheet for Patients: EntrepreneurPulse.com.au  Fact Sheet for Healthcare Providers: IncredibleEmployment.be  This test is not yet approved or cleared by the [place] FDA and has been authorized for detection and/or diagnosis of SARS-CoV-2 by FDA under an Emergency Use Authorization (EUA). This EUA will remain in effect (meaning this test can be used) for the duration of the COVID-19 declaration under Section 564(b)(1) of the Act, 21 U.S.C. section 360bbb-3(b)(1), unless the authorization is terminated or revoked.  Performed at [facility], [address] [place], [place] [address]   Urinalysis, Routine w reflex microscopic Urine, Clean Catch     Status: Abnormal   Collection Time: 06/13/21  4:59 PM  Result Value Ref Range   Color, Urine AMBER (A) YELLOW    Comment: BIOCHEMICALS MAY BE AFFECTED BY COLOR   APPearance CLOUDY (A) CLEAR   Specific Gravity, Urine >1.030 (H) 1.005 - 1.030   pH 6.0 5.0 - 8.0   Glucose, UA NEGATIVE NEGATIVE mg/dL   Hgb urine dipstick SMALL (A) NEGATIVE   Bilirubin Urine MODERATE (A) NEGATIVE   Ketones, ur 80 (A) NEGATIVE mg/dL   Protein, ur 100 (A) NEGATIVE mg/dL   Nitrite NEGATIVE NEGATIVE   Leukocytes,Ua NEGATIVE NEGATIVE    Comment: Performed at [facility], [address] [place], [place] [address]  Urinalysis, Microscopic (reflex)     Status: Abnormal   Collection Time: 06/13/21  4:59 PM  Result Value Ref Range   RBC / HPF 0-5 0 - 5 RBC/hpf   WBC, UA 0-5 0 - 5 WBC/hpf   Bacteria, UA MANY (A) NONE SEEN   Squamous Epithelial / LPF 0-5 0 - 5   Mucus PRESENT     Comment: Performed at [facility], [address] [place], [place] [address]  Lactic acid, plasma  Status: None   Collection Time: 06/13/21  5:00 PM  Result Value Ref Range   Lactic Acid, Venous 1.5 0.5 - 1.9 mmol/L    Comment: Performed at [facility], [address] [place], [place] [address]  Comprehensive metabolic panel     Status: Abnormal   Collection Time: 06/13/21  5:00 PM  Result Value Ref Range   Sodium 131 (L) 135 - 145 mmol/L   Potassium 3.0 (L) 3.5 - 5.1 mmol/L   Chloride 92 (L) 98 - 111 mmol/L   CO2 23 22 - 32 mmol/L   Glucose, Bld 121 (H) 70 - 99 mg/dL    Comment: Glucose reference range applies only to samples taken after fasting for at least 8 hours.   BUN 16 6 - 20 mg/dL   Creatinine, Ser 0.79 0.44 - 1.00 mg/dL   Calcium 9.2 8.9 - 10.3 mg/dL   Total Protein 8.4 (H) 6.5 - 8.1 g/dL   Albumin 3.6 3.5 - 5.0 g/dL   AST 16 15 - 41 U/L   ALT 13 0 - 44 U/L   Alkaline Phosphatase 118 38 - 126 U/L   Total Bilirubin 1.2 0.3 - 1.2 mg/dL   GFR, Estimated >60 >60 mL/min    Comment: (NOTE) Calculated using the CKD-EPI Creatinine Equation (2021)    Anion gap 16 (H) 5 - 15    Comment: Performed at [facility], [address] [place], [place] [address]  CBC WITH DIFFERENTIAL     Status: Abnormal   Collection Time: 06/13/21  5:00 PM  Result Value Ref Range   WBC 23.6 (H) 4.0 - 10.5 K/uL   RBC 4.97 3.87 - 5.11 MIL/uL   Hemoglobin 16.6 (H) 12.0 - 15.0 g/dL   HCT 47.0 (H) 36.0 - 46.0 %   MCV 94.6 80.0 - 100.0 fL   MCH 33.4 26.0 - 34.0 pg   MCHC 35.3 30.0 - 36.0 g/dL   RDW 13.2 11.5 - 15.5 %   Platelets 272 150 - 400 K/uL   nRBC 0.0 0.0 - 0.2 %   Neutrophils Relative % 89 %   Neutro Abs 20.9 (H) 1.7 - 7.7 K/uL   Lymphocytes Relative 5 %   Lymphs Abs 1.2 0.7 - 4.0 K/uL   Monocytes Relative 5 %   Monocytes Absolute 1.3 (H) 0.1 - 1.0 K/uL   Eosinophils Relative 0 %   Eosinophils Absolute 0.0 0.0 - 0.5 K/uL   Basophils Relative 0 %   Basophils Absolute 0.1 0.0 - 0.1 K/uL   Immature Granulocytes 1 %   Abs Immature Granulocytes 0.13 (H) 0.00 - 0.07 K/uL    Comment: Performed at [facility], [address] [place], [place] [address]  Protime-INR     Status: None   Collection Time: 06/13/21  5:00 PM   Result Value Ref Range   Prothrombin Time 13.9 11.4 - 15.2 seconds   INR 1.1 0.8 - 1.2    Comment: (NOTE) INR goal varies based on device and disease states. Performed at [facility], [address] [place], [place] [address]   APTT     Status: None   Collection Time: 06/13/21  5:00 PM  Result Value Ref Range   aPTT 25 24 - 36 seconds    Comment: Performed at [facility], [address] [place], [place] [address]  Pregnancy, urine     Status: None   Collection  Time: 06/13/21  5:01 PM  Result Value Ref Range   Preg Test, Ur NEGATIVE NEGATIVE    Comment:        THE SENSITIVITY OF THIS METHODOLOGY IS >20 mIU/mL. Performed at [facility], [address] [place], [place] [address]     CT ABDOMEN PELVIS W CONTRAST  Result Date: 06/13/2021 CLINICAL DATA:  Bilateral lower quadrant pain and fever. Patient reports right-sided pelvic pain since Friday. EXAM: CT ABDOMEN AND PELVIS WITH CONTRAST TECHNIQUE: Multidetector CT imaging of the abdomen and pelvis was performed using the standard protocol following bolus administration of intravenous contrast. RADIATION DOSE REDUCTION: This exam was performed according to the departmental dose-optimization program which includes automated exposure control, adjustment of the mA and/or kV according to patient size and/or use of iterative reconstruction technique. CONTRAST:  OMNIPAQUE IOHEXOL 300 MG/ML  SOLN COMPARISON:  None. FINDINGS: Lower chest: No acute airspace disease or pleural effusion. Hepatobiliary: No focal liver abnormality is seen. Possible but not definite gallstone. No pericholecystic fat stranding or inflammation. No biliary dilatation. Pancreas: No ductal dilatation or inflammation. Spleen: Normal in size without focal abnormality. Small cleft posteriorly. Adrenals/Urinary Tract: No adrenal nodule. No hydronephrosis or perinephric edema. Homogeneous renal enhancement with symmetric excretion on  delayed phase imaging. No renal calculi or focal renal lesion. Urinary bladder is near completely empty. Stomach/Bowel: There is a linear metallic density in the sigmoid colon. This spans approximately 2.9 cm and is hooked at the end. There is associated sigmoid bowel perforation with mottled extraluminal air, colonic wall thickening, and fat stranding. Small amount of non organized trace fluid. This is in the pelvis just to the left of midline. Free air tracks into the left upper quadrant. No drainable fluid collection. The appendix is visualized and is normal. Small volume of colonic stool. Occasional fluid-filled loops of small bowel, mildly edematous in the pelvis, likely reactive. No small bowel obstruction. Stomach is decompressed. Vascular/Lymphatic: Aortic atherosclerosis. No aortic aneurysm. There is no portal venous or mesenteric gas. Scattered prominent mesenteric lymph nodes, typically reactive. Few prominent inguinal nodes. Reproductive: T-shaped IUD in the uterus.  No adnexal mass. Other: Mild free air in the pelvis with small amount of non organized free fluid. Patchy soft tissue gas tracks into the left upper abdomen. No drainable collection. Musculoskeletal: There are no acute or suspicious osseous abnormalities. IMPRESSION: 1. Linear metallic density in the sigmoid colon spanning approximately 2.9 cm and hooked at the end, consistent with foreign body. There is associated sigmoid bowel perforation with mottled extraluminal air, colonic wall thickening, none organized trace fluid and fat stranding. Free air tracks into the left upper quadrant of the abdomen. No drainable fluid collection. 2. Possible but not definite gallstone. No evidence of acute cholecystitis. Aortic Atherosclerosis (ICD10-I70.0). Critical Value/emergent results were called by telephone at the time of interpretation on 06/13/2021 at 6:31 pm to provider ADAM CURATOLO , who verbally acknowledged these results. Electronically Signed    By: Narda Rutherford M.D.   On: 06/13/2021 18:32   DG Chest Port 1 View  Result Date: 06/13/2021 CLINICAL DATA:  Right-sided pelvic pain dark urine EXAM: PORTABLE CHEST 1 VIEW COMPARISON:  None. FINDINGS: The heart size and mediastinal contours are within normal limits. Both lungs are clear. The visualized skeletal structures are unremarkable. IMPRESSION: No active disease. Electronically Signed   By: Jasmine Pang M.D.   On: 06/13/2021 17:37    I have personally reviewed the relevant labs and CT scan  A/P:  Focal perforation of the sigmoid colon from a foreign body.  She surprisingly is only focally tender and does not have diffuse peritonitis.  I suspect the foreign body has been there for some time and is now caused a focal perforation.  There is currently no fluid in the area.  I discussed the situation with the patient and her husband.  I also discussed this with my partner, Dr. Hassell Done, who is also on-call.  She would like to try to avoid a colostomy.  We discussed going to the operating room tonight which was necessitate a resection and a colostomy versus an attempt at conservative management with IV rehydration and IV antibiotics.  We will then discuss this with gastroenterology to determine whether sigmoidoscopy can be performed potentially even in the operating room to remove the foreign body if possible in the attempts of trying to avoid a colostomy.  The patient would like to try conservative management.  She understands that if she acutely worsens that we would proceed to the operating room this evening for emergent surgery and a colostomy or even in the morning.  We will notify gastroenterology in the morning for possible consultation as well.  This required high complex medical decision making 80making  I spent a total of 75 minutes in both face-to-face and non-face-to-face activities, excluding procedures performed, for this H&P on the date of this encounter.  Coralie Keens, MD  [facility], [facility]

## 2021-06-13 NOTE — ED Notes (Signed)
XR at bedside

## 2021-06-13 NOTE — Sepsis Progress Note (Signed)
eLink monitoring code sepsis.  

## 2021-06-14 ENCOUNTER — Other Ambulatory Visit: Payer: Self-pay

## 2021-06-14 ENCOUNTER — Encounter (HOSPITAL_COMMUNITY): Admission: EM | Disposition: A | Discharge status: Home / Self Care | Payer: Self-pay | Source: Home / Self Care

## 2021-06-14 ENCOUNTER — Inpatient Hospital Stay (HOSPITAL_COMMUNITY): Payer: Self-pay | Admitting: Certified Registered Nurse Anesthetist

## 2021-06-14 ENCOUNTER — Encounter (HOSPITAL_COMMUNITY): Payer: Self-pay | Admitting: Surgery

## 2021-06-14 HISTORY — PX: LAPAROTOMY: SHX154

## 2021-06-14 HISTORY — PX: FLEXIBLE SIGMOIDOSCOPY: SHX5431

## 2021-06-14 LAB — BASIC METABOLIC PANEL
Anion gap: 13 (ref 5–15)
BUN: 17 mg/dL (ref 6–20)
CO2: 22 mmol/L (ref 22–32)
Calcium: 8.5 mg/dL — ABNORMAL LOW (ref 8.9–10.3)
Chloride: 99 mmol/L (ref 98–111)
Creatinine, Ser: 0.61 mg/dL (ref 0.44–1.00)
GFR, Estimated: >60 mL/min (ref >60)
Glucose, Bld: 99 mg/dL (ref 70–99)
Potassium: 3.1 mmol/L — ABNORMAL LOW (ref 3.5–5.1)
Sodium: 134 mmol/L — ABNORMAL LOW (ref 135–145)

## 2021-06-14 LAB — CBC
HCT: 43.7 % (ref 36.0–46.0)
Hemoglobin: 14.8 g/dL (ref 12.0–15.0)
MCH: 33.2 pg (ref 26.0–34.0)
MCHC: 33.9 g/dL (ref 30.0–36.0)
MCV: 98 fL (ref 80.0–100.0)
Platelets: 250 10*3/uL (ref 150–400)
RBC: 4.46 MIL/uL (ref 3.87–5.11)
RDW: 13.4 % (ref 11.5–15.5)
WBC: 18.5 10*3/uL — ABNORMAL HIGH (ref 4.0–10.5)
nRBC: 0 % (ref 0.0–0.2)

## 2021-06-14 LAB — HIV ANTIBODY (ROUTINE TESTING W REFLEX): HIV Screen 4th Generation wRfx: NONREACTIVE

## 2021-06-14 SURGERY — SIGMOIDOSCOPY, FLEXIBLE
Anesthesia: Monitor Anesthesia Care

## 2021-06-14 SURGERY — LAPAROTOMY, EXPLORATORY
Anesthesia: Monitor Anesthesia Care

## 2021-06-14 MED ORDER — PANTOPRAZOLE SODIUM 40 MG IV SOLR
40.0000 mg | Freq: Every day | INTRAVENOUS | Status: DC
  Administered 2021-06-14 – 2021-06-19 (×6): 40 mg via INTRAVENOUS
  Filled 2021-06-14 (×6): qty 40

## 2021-06-14 MED ORDER — KCL IN DEXTROSE-NACL 20-5-0.45 MEQ/L-%-% IV SOLN
INTRAVENOUS | Status: DC
  Filled 2021-06-14 (×10): qty 1000

## 2021-06-14 MED ORDER — FENTANYL CITRATE PF 50 MCG/ML IJ SOSY
PREFILLED_SYRINGE | INTRAMUSCULAR | Status: AC
  Filled 2021-06-14: qty 3

## 2021-06-14 MED ORDER — ROCURONIUM BROMIDE 100 MG/10ML IV SOLN
INTRAVENOUS | Status: DC | PRN
  Administered 2021-06-14: 50 mg via INTRAVENOUS
  Administered 2021-06-14: 20 mg via INTRAVENOUS

## 2021-06-14 MED ORDER — FENTANYL CITRATE (PF) 250 MCG/5ML IJ SOLN
INTRAMUSCULAR | Status: AC
  Filled 2021-06-14: qty 5

## 2021-06-14 MED ORDER — ONDANSETRON HCL 4 MG/2ML IJ SOLN
INTRAMUSCULAR | Status: AC
  Filled 2021-06-14: qty 2

## 2021-06-14 MED ORDER — PROMETHAZINE HCL 25 MG/ML IJ SOLN: 6.2500 mg | INTRAMUSCULAR | Status: DC | PRN

## 2021-06-14 MED ORDER — ESMOLOL HCL 100 MG/10ML IV SOLN
INTRAVENOUS | Status: AC
  Filled 2021-06-14: qty 10

## 2021-06-14 MED ORDER — SODIUM CHLORIDE 0.9 % IR SOLN
Status: DC | PRN
  Administered 2021-06-14: 1000 mL

## 2021-06-14 MED ORDER — ENOXAPARIN SODIUM 40 MG/0.4ML IJ SOSY
40.0000 mg | PREFILLED_SYRINGE | INTRAMUSCULAR | Status: DC
  Administered 2021-06-15 – 2021-06-22 (×8): 40 mg via SUBCUTANEOUS
  Filled 2021-06-14 (×8): qty 0.4

## 2021-06-14 MED ORDER — PHENYLEPHRINE 40 MCG/ML (10ML) SYRINGE FOR IV PUSH (FOR BLOOD PRESSURE SUPPORT)
PREFILLED_SYRINGE | INTRAVENOUS | Status: DC | PRN
  Administered 2021-06-14: 80 ug via INTRAVENOUS

## 2021-06-14 MED ORDER — ONDANSETRON HCL 4 MG/2ML IJ SOLN: 4.0000 mg | Freq: Four times a day (QID) | INTRAMUSCULAR | Status: DC | PRN

## 2021-06-14 MED ORDER — HYDROMORPHONE HCL 1 MG/ML IJ SOLN
1.0000 mg | INTRAMUSCULAR | Status: DC | PRN
  Administered 2021-06-14 – 2021-06-20 (×31): 1 mg via INTRAVENOUS
  Filled 2021-06-14 (×33): qty 1

## 2021-06-14 MED ORDER — PROPOFOL 10 MG/ML IV BOLUS
INTRAVENOUS | Status: AC
  Filled 2021-06-14: qty 20

## 2021-06-14 MED ORDER — ESMOLOL HCL 100 MG/10ML IV SOLN
INTRAVENOUS | Status: DC | PRN
  Administered 2021-06-14: 30 mg via INTRAVENOUS
  Administered 2021-06-14 (×2): 20 mg via INTRAVENOUS

## 2021-06-14 MED ORDER — ONDANSETRON 4 MG PO TBDP: 4.0000 mg | ORAL_TABLET | Freq: Four times a day (QID) | ORAL | Status: DC | PRN

## 2021-06-14 MED ORDER — OXYCODONE HCL 5 MG/5ML PO SOLN: 5.0000 mg | Freq: Once | ORAL | Status: DC | PRN

## 2021-06-14 MED ORDER — FENTANYL CITRATE PF 50 MCG/ML IJ SOSY
25.0000 ug | PREFILLED_SYRINGE | INTRAMUSCULAR | Status: DC | PRN
  Administered 2021-06-14 (×3): 50 ug via INTRAVENOUS

## 2021-06-14 MED ORDER — FENTANYL CITRATE (PF) 100 MCG/2ML IJ SOLN
INTRAMUSCULAR | Status: AC
  Filled 2021-06-14: qty 2

## 2021-06-14 MED ORDER — LABETALOL HCL 5 MG/ML IV SOLN
INTRAVENOUS | Status: AC
  Filled 2021-06-14: qty 4

## 2021-06-14 MED ORDER — PHENYLEPHRINE 40 MCG/ML (10ML) SYRINGE FOR IV PUSH (FOR BLOOD PRESSURE SUPPORT)
PREFILLED_SYRINGE | INTRAVENOUS | Status: AC
  Filled 2021-06-14: qty 10

## 2021-06-14 MED ORDER — LIDOCAINE HCL (PF) 2 % IJ SOLN
INTRAMUSCULAR | Status: AC
  Filled 2021-06-14: qty 5

## 2021-06-14 MED ORDER — ACETAMINOPHEN 10 MG/ML IV SOLN: 1000.0000 mg | Freq: Once | INTRAVENOUS | Status: DC | PRN

## 2021-06-14 MED ORDER — ACETAMINOPHEN 325 MG PO TABS: 650.0000 mg | ORAL_TABLET | Freq: Four times a day (QID) | ORAL | Status: DC | PRN

## 2021-06-14 MED ORDER — PROPOFOL 1000 MG/100ML IV EMUL
INTRAVENOUS | Status: AC
  Filled 2021-06-14: qty 100

## 2021-06-14 MED ORDER — OXYCODONE HCL 5 MG PO TABS: 5.0000 mg | ORAL_TABLET | Freq: Once | ORAL | Status: DC | PRN

## 2021-06-14 MED ORDER — AMISULPRIDE (ANTIEMETIC) 5 MG/2ML IV SOLN: 10.0000 mg | Freq: Once | INTRAVENOUS | Status: DC | PRN

## 2021-06-14 MED ORDER — ACETAMINOPHEN 650 MG RE SUPP: 650.0000 mg | Freq: Four times a day (QID) | RECTAL | Status: DC | PRN

## 2021-06-14 MED ORDER — SUGAMMADEX SODIUM 200 MG/2ML IV SOLN
INTRAVENOUS | Status: DC | PRN
  Administered 2021-06-14: 200 mg via INTRAVENOUS

## 2021-06-14 MED ORDER — LIDOCAINE 2% (20 MG/ML) 5 ML SYRINGE
INTRAMUSCULAR | Status: DC | PRN
  Administered 2021-06-14: 80 mg via INTRAVENOUS

## 2021-06-14 MED ORDER — PIPERACILLIN-TAZOBACTAM 3.375 G IVPB
3.3750 g | Freq: Three times a day (TID) | INTRAVENOUS | Status: DC
  Administered 2021-06-14 – 2021-06-20 (×17): 3.375 g via INTRAVENOUS
  Filled 2021-06-14 (×19): qty 50

## 2021-06-14 MED ORDER — ONDANSETRON HCL 4 MG/2ML IJ SOLN
INTRAMUSCULAR | Status: DC | PRN
Start: 2021-06-14 — End: 2021-06-14
  Administered 2021-06-14: 4 mg via INTRAVENOUS

## 2021-06-14 MED ORDER — LABETALOL HCL 5 MG/ML IV SOLN
INTRAVENOUS | Status: DC | PRN
  Administered 2021-06-14 (×4): 5 mg via INTRAVENOUS

## 2021-06-14 MED ORDER — DEXAMETHASONE SODIUM PHOSPHATE 10 MG/ML IJ SOLN
INTRAMUSCULAR | Status: AC
  Filled 2021-06-14: qty 1

## 2021-06-14 MED ORDER — PROPOFOL 500 MG/50ML IV EMUL
INTRAVENOUS | Status: DC | PRN
  Administered 2021-06-14: 100 ug/kg/min via INTRAVENOUS

## 2021-06-14 MED ORDER — PROPOFOL 10 MG/ML IV BOLUS
INTRAVENOUS | Status: DC | PRN
  Administered 2021-06-14 (×3): 40 mg via INTRAVENOUS
  Administered 2021-06-14: 50 mg via INTRAVENOUS
  Administered 2021-06-14: 30 mg via INTRAVENOUS

## 2021-06-14 MED ORDER — ROCURONIUM BROMIDE 10 MG/ML (PF) SYRINGE
PREFILLED_SYRINGE | INTRAVENOUS | Status: AC
  Filled 2021-06-14: qty 10

## 2021-06-14 MED ORDER — LACTATED RINGERS IV SOLN: INTRAVENOUS | Status: DC

## 2021-06-14 MED ORDER — FENTANYL CITRATE (PF) 250 MCG/5ML IJ SOLN
INTRAMUSCULAR | Status: DC | PRN
  Administered 2021-06-14 (×5): 50 ug via INTRAVENOUS
  Administered 2021-06-14: 150 ug via INTRAVENOUS
  Administered 2021-06-14: 50 ug via INTRAVENOUS

## 2021-06-14 SURGICAL SUPPLY — 55 items
APL PRP STRL LF DISP 70% ISPRP (MISCELLANEOUS) ×1
APL SWBSTK 6 STRL LF DISP (MISCELLANEOUS)
APPLICATOR COTTON TIP 6 STRL (MISCELLANEOUS) ×1 IMPLANT
APPLICATOR COTTON TIP 6IN STRL (MISCELLANEOUS) IMPLANT
BAG COUNTER SPONGE SURGICOUNT (BAG) IMPLANT
BAG SPNG CNTER NS LX DISP (BAG)
BLADE EXTENDED COATED 6.5IN (ELECTRODE) ×1 IMPLANT
BLADE HEX COATED 2.75 (ELECTRODE) ×2 IMPLANT
BNDG GAUZE ELAST 4 BULKY (GAUZE/BANDAGES/DRESSINGS) ×1 IMPLANT
CHLORAPREP W/TINT 26 (MISCELLANEOUS) ×2 IMPLANT
COVER MAYO STAND STRL (DRAPES) IMPLANT
COVER SURGICAL LIGHT HANDLE (MISCELLANEOUS) ×2 IMPLANT
DRAPE LAPAROSCOPIC ABDOMINAL (DRAPES) ×2 IMPLANT
DRAPE WARM FLUID 44X44 (DRAPES) ×1 IMPLANT
DRSG PAD ABDOMINAL 8X10 ST (GAUZE/BANDAGES/DRESSINGS) ×2 IMPLANT
ELECT REM PT RETURN 15FT ADLT (MISCELLANEOUS) ×2 IMPLANT
GAUZE SPONGE 4X4 12PLY STRL (GAUZE/BANDAGES/DRESSINGS) ×2 IMPLANT
GLOVE SURG ORTHO LTX SZ8 (GLOVE) ×2 IMPLANT
GLOVE SURG SYN 7.5  E (GLOVE) ×1
GLOVE SURG SYN 7.5 E (GLOVE) ×1 IMPLANT
GLOVE SURG SYN 7.5 PF PI (GLOVE) ×1 IMPLANT
GLOVE SURG UNDER POLY LF SZ7 (GLOVE) ×2 IMPLANT
GOWN STRL REUS W/TWL LRG LVL3 (GOWN DISPOSABLE) ×2 IMPLANT
GOWN STRL REUS W/TWL XL LVL3 (GOWN DISPOSABLE) ×5 IMPLANT
HANDLE SUCTION POOLE (INSTRUMENTS) IMPLANT
KIT BASIN OR (CUSTOM PROCEDURE TRAY) ×2 IMPLANT
KIT TURNOVER KIT A (KITS) IMPLANT
LIGASURE IMPACT 36 18CM CVD LR (INSTRUMENTS) ×1 IMPLANT
NS IRRIG 1000ML POUR BTL (IV SOLUTION) ×2 IMPLANT
PACK GENERAL/GYN (CUSTOM PROCEDURE TRAY) ×2 IMPLANT
PREP POVIDONE IODINE SPRAY 2OZ (MISCELLANEOUS) ×1 IMPLANT
SOL PREP POV-IOD 4OZ 10% (MISCELLANEOUS) ×1 IMPLANT
SPONGE T-LAP 18X18 ~~LOC~~+RFID (SPONGE) ×3 IMPLANT
STAPLER GUN LINEAR PROX 60 (STAPLE) ×1 IMPLANT
STAPLER PROXIMATE 75MM BLUE (STAPLE) ×1 IMPLANT
STAPLER VISISTAT 35W (STAPLE) ×2 IMPLANT
SUCTION POOLE HANDLE (INSTRUMENTS)
SUT NOV 1 T60/GS (SUTURE) IMPLANT
SUT NOVA NAB GS-21 1 T12 (SUTURE) ×2 IMPLANT
SUT PROLENE 2 0 SH DA (SUTURE) ×1 IMPLANT
SUT SILK 2 0 (SUTURE)
SUT SILK 2 0 SH CR/8 (SUTURE) ×1 IMPLANT
SUT SILK 2-0 18XBRD TIE 12 (SUTURE) IMPLANT
SUT SILK 3 0 (SUTURE)
SUT SILK 3 0 SH CR/8 (SUTURE) IMPLANT
SUT SILK 3-0 18XBRD TIE 12 (SUTURE) IMPLANT
SUT VIC AB 2-0 SH 18 (SUTURE) ×1 IMPLANT
SUT VICRYL 2 0 18  UND BR (SUTURE)
SUT VICRYL 2 0 18 UND BR (SUTURE) IMPLANT
TAPE CLOTH SURG 6X10 WHT LF (GAUZE/BANDAGES/DRESSINGS) ×1 IMPLANT
TOWEL OR 17X26 10 PK STRL BLUE (TOWEL DISPOSABLE) ×4 IMPLANT
TRAY FOLEY MTR SLVR 16FR STAT (SET/KITS/TRAYS/PACK) IMPLANT
TUBING CONNECTING 10 (TUBING) ×1 IMPLANT
WATER STERILE IRR 1000ML POUR (IV SOLUTION) ×1 IMPLANT
YANKAUER SUCT BULB TIP NO VENT (SUCTIONS) IMPLANT

## 2021-06-14 NOTE — Progress Notes (Signed)
Assessment & Plan: Colonic perforation secondary to foreign body  Plan sigmoidoscopy this afternoon by Dr. Marca Ancona in OR  If significant perforation, will require laparotomy and resection with colostomy  IV Zosyn  NPO  Discussed with patient and family at bedside.  Will try endoscopic retrieval of partial denture prosthesis by Dr. Marca Ancona.  Will do in OR and be prepared for laparotomy.  If successful retrieval without significant perforation, will manage like diverticular disease (plan per Dr. Magnus Ivan).  Patient and family understand and agree to proceed later today.        Darnell Level, MD       [facility]       Office: [phone]   Chief Complaint: Abdominal pain, colonic perforation secondary to foreign body  Subjective: Patient appears comfortable, family at bedside  Objective: Vital signs in last 24 hours: Temp:  [98.3 F (36.8 C)-101.3 F (38.5 C)] 98.5 F (36.9 C) (01/24 0644) Pulse Rate:  [98-140] 104 (01/24 0644) Resp:  [12-29] 18 (01/24 0644) BP: (136-190)/(87-112) 152/87 (01/24 0644) SpO2:  [89 %-99 %] 96 % (01/24 0644) Weight:  [72.6 kg] 72.6 kg (01/23 1654) Last BM Date: 06/14/21  Intake/Output from previous day: 01/23 0701 - 01/24 0700 In: 2021.3 [I.V.:921.3; IV Piggyback:1100] Out: -  Intake/Output this shift: No intake/output data recorded.  Physical Exam: HEENT - sclerae clear, mucous membranes moist Neck - soft Abdomen - soft, obese; mild tenderness to palpation LLQ, no mass, no guarding Ext - no edema, non-tender Neuro - alert & oriented, no focal deficits  Lab Results:  Recent Labs    06/13/21 1700 06/14/21 0317  WBC 23.6* 18.5*  HGB 16.6* 14.8  HCT 47.0* 43.7  PLT 272 250   BMET Recent Labs    06/13/21 1700 06/14/21 0317  NA 131* 134*  K 3.0* 3.1*  CL 92* 99  CO2 23 22  GLUCOSE 121* 99  BUN 16 17  CREATININE 0.79 0.61  CALCIUM 9.2 8.5*   PT/INR Recent Labs    06/13/21 1700  LABPROT 13.9  INR 1.1    Comprehensive Metabolic Panel:    Component Value Date/Time   NA 134 (L) 06/14/2021 0317   NA 131 (L) 06/13/2021 1700   K 3.1 (L) 06/14/2021 0317   K 3.0 (L) 06/13/2021 1700   CL 99 06/14/2021 0317   CL 92 (L) 06/13/2021 1700   CO2 22 06/14/2021 0317   CO2 23 06/13/2021 1700   BUN 17 06/14/2021 0317   BUN 16 06/13/2021 1700   CREATININE 0.61 06/14/2021 0317   CREATININE 0.79 06/13/2021 1700   GLUCOSE 99 06/14/2021 0317   GLUCOSE 121 (H) 06/13/2021 1700   CALCIUM 8.5 (L) 06/14/2021 0317   CALCIUM 9.2 06/13/2021 1700   AST 16 06/13/2021 1700   ALT 13 06/13/2021 1700   ALKPHOS 118 06/13/2021 1700   BILITOT 1.2 06/13/2021 1700   PROT 8.4 (H) 06/13/2021 1700   ALBUMIN 3.6 06/13/2021 1700    Studies/Results: CT ABDOMEN PELVIS W CONTRAST  Result Date: 06/13/2021 CLINICAL DATA:  Bilateral lower quadrant pain and fever. Patient reports right-sided pelvic pain since Friday. EXAM: CT ABDOMEN AND PELVIS WITH CONTRAST TECHNIQUE: Multidetector CT imaging of the abdomen and pelvis was performed using the standard protocol following bolus administration of intravenous contrast. RADIATION DOSE REDUCTION: This exam was performed according to the departmental dose-optimization program which includes automated exposure control, adjustment of the mA and/or kV according to patient size and/or use of iterative reconstruction technique.  CONTRAST:  167mL OMNIPAQUE IOHEXOL 300 MG/ML  SOLN COMPARISON:  None. FINDINGS: Lower chest: No acute airspace disease or pleural effusion. Hepatobiliary: No focal liver abnormality is seen. Possible but not definite gallstone. No pericholecystic fat stranding or inflammation. No biliary dilatation. Pancreas: No ductal dilatation or inflammation. Spleen: Normal in size without focal abnormality. Small cleft posteriorly. Adrenals/Urinary Tract: No adrenal nodule. No hydronephrosis or perinephric edema. Homogeneous renal enhancement with symmetric excretion on delayed phase  imaging. No renal calculi or focal renal lesion. Urinary bladder is near completely empty. Stomach/Bowel: There is a linear metallic density in the sigmoid colon. This spans approximately 2.9 cm and is hooked at the end. There is associated sigmoid bowel perforation with mottled extraluminal air, colonic wall thickening, and fat stranding. Small amount of non organized trace fluid. This is in the pelvis just to the left of midline. Free air tracks into the left upper quadrant. No drainable fluid collection. The appendix is visualized and is normal. Small volume of colonic stool. Occasional fluid-filled loops of small bowel, mildly edematous in the pelvis, likely reactive. No small bowel obstruction. Stomach is decompressed. Vascular/Lymphatic: Aortic atherosclerosis. No aortic aneurysm. There is no portal venous or mesenteric gas. Scattered prominent mesenteric lymph nodes, typically reactive. Few prominent inguinal nodes. Reproductive: T-shaped IUD in the uterus.  No adnexal mass. Other: Mild free air in the pelvis with small amount of non organized free fluid. Patchy soft tissue gas tracks into the left upper abdomen. No drainable collection. Musculoskeletal: There are no acute or suspicious osseous abnormalities. IMPRESSION: 1. Linear metallic density in the sigmoid colon spanning approximately 2.9 cm and hooked at the end, consistent with foreign body. There is associated sigmoid bowel perforation with mottled extraluminal air, colonic wall thickening, none organized trace fluid and fat stranding. Free air tracks into the left upper quadrant of the abdomen. No drainable fluid collection. 2. Possible but not definite gallstone. No evidence of acute cholecystitis. Aortic Atherosclerosis (ICD10-I70.0). Critical Value/emergent results were called by telephone at the time of interpretation on 06/13/2021 at 6:31 pm to provider ADAM CURATOLO , who verbally acknowledged these results. Electronically Signed   By: Keith Rake M.D.   On: 06/13/2021 18:32   DG Chest Port 1 View  Result Date: 06/13/2021 CLINICAL DATA:  Right-sided pelvic pain dark urine EXAM: PORTABLE CHEST 1 VIEW COMPARISON:  None. FINDINGS: The heart size and mediastinal contours are within normal limits. Both lungs are clear. The visualized skeletal structures are unremarkable. IMPRESSION: No active disease. Electronically Signed   By: Donavan Foil M.D.   On: 06/13/2021 17:37      Armandina Gemma 06/14/2021   Patient ID: Misty Peck, female   DOB: 04-18-1976, 46 y.o.   MRN: [id number]

## 2021-06-14 NOTE — Plan of Care (Signed)
  Problem: Pain Managment: Goal: General experience of comfort will improve Outcome: Progressing   Problem: Safety: Goal: Ability to remain free from injury will improve Outcome: Progressing   

## 2021-06-14 NOTE — Op Note (Addendum)
[facility] Patient Name: Misty DupontCrystal Peck Procedure Date: 06/14/2021 MRN: [id number] Attending MD: Misty SalenArya Garielle Peck , MD Date of Birth: April 27, 1976 CSN: [id number] Age: [age] Admit Type: Inpatient Procedure:                Flexible Sigmoidoscopy Indications:              Abnormal CT of the GI tract, perforated sigmoid                            colon due to retained foreign body (swallowed                            dentures), attempt to remove foreign body in the OR                            in presence of general surgeon(standy if immediate                            assistance needed) Providers:                Misty SalenArya Renai Lopata, MD, Misty ConnorsMichael Peck, Misty ScarletJasmine Peck,                            Technician, Misty CeraJosh Jarvela, CRNA Referring MD:             [facility], General Surgery Medicines:                Monitored Anesthesia Care Complications:            No immediate complications. Estimated Blood Loss:     Estimated blood loss: none. Procedure:                Pre-Anesthesia Assessment:                           - Prior to the procedure, a History and Physical                            was performed, and patient medications and                            allergies were reviewed. The patient's tolerance of                            previous anesthesia was also reviewed. The risks                            and benefits of the procedure and the sedation                            options and risks were discussed with the patient.                            All questions were answered, and informed consent  was obtained. Prior Anticoagulants: The patient has                            taken no previous anticoagulant or antiplatelet                            agents. ASA Grade Assessment: II - A patient with                            mild systemic disease. After reviewing the risks                            and benefits, the patient was deemed in                             satisfactory condition to undergo the procedure.                           After obtaining informed consent, the scope was                            passed under direct vision. The [id number]-H190 ([id number])                            Olympus endoscope was introduced through the anus                            and advanced to the 30 cm from the anal verge. The                            [id number]-[id number] ([id number]) Olympus colonoscope was                            introduced through the anus and advanced to the.                            The flexible sigmoidoscopy was accomplished without                            difficulty. The patient tolerated the procedure                            well. The quality of the bowel preparation was poor. Scope In: 3:22:58 PM Scope Out: 3:31:48 PM Total Procedure Duration: 0 hours 8 minutes 50 seconds  Findings:      A moderate amount of liquid and semi-liquid stool was found in the       rectum, in the recto-sigmoid colon and in the sigmoid colon, making       visualization difficult. Lavage of the area was performed, resulting in       clearance with fair visualization.      A localized area of severely thickened folds of the mucosa was found in       the sigmoid colon.      A part of  the retained foreign body was noted at 25 cm from insertion.       It seemed embedded within the colonic wall. An area of ulceration and       perforation was noted at the site of the foreign body.      Due to orientation of the foreign body(embedded within the colonic       wall), inability to visualize the entire foreign body, associated       perforation and ulceration, an attempt was not made to grasp the foreign       body.      A 7 mm polyp was found in the rectum. The polyp was sessile. Polypectomy       was not attempted.      Hemorrhoids were found on perianal exam. Impression:               Retained foreign body seemed embedded within                             colonic wall, associated with perforation.                           - Preparation of the colon was poor.                           - Stool in the rectum, in the recto-sigmoid colon                            and in the sigmoid colon.                           - Thickened folds of the mucosa in the sigmoid                            colon.                           - One 7 mm polyp in the rectum. Resection not                            attempted.                           - Hemorrhoids found on perianal exam.                           - No specimens collected. Moderate Sedation:      Patient did not receive moderate sedation for this procedure, but       instead received monitored anesthesia care. Recommendation:           - Dr.Gerkin from General Surgery will operate on                            the patient immediately for likely colonic                            resection.                           -  Perform a colonoscopy at appointment to be                            scheduled as an outpatient.                           This will likely need to be done in the next 2-3                            months after surgery, as she had a rectal polyp                            which was not removed and she has never had a full                            colonoscopy. Procedure Code(s):        --- Professional ---                           [phone], Sigmoidoscopy, flexible; diagnostic,                            including collection of specimen(s) by brushing or                            washing, when performed (separate procedure) Diagnosis Code(s):        --- Professional ---                           K63.89, Other specified diseases of intestine                           K62.1, Rectal polyp                           R93.3, Abnormal findings on diagnostic imaging of                            other parts of digestive tract CPT copyright 2019 [employer]. All rights  reserved. The codes documented in this report are preliminary and upon coder review may  be revised to meet current compliance requirements. Misty Salen, MD 06/14/2021 4:17:40 PM This report has been signed electronically. Number of Addenda: 0

## 2021-06-14 NOTE — Progress Notes (Signed)
Initial Nutrition Assessment  DOCUMENTATION CODES:  Not applicable  INTERVENTION:  Diet progression to regular as able after procedures this PM Add nutrition supplements to augment intake pending diet advancement Obtain NFPE as able to assess fat/muscle stores  NUTRITION DIAGNOSIS:  Inadequate oral intake related to inability to eat, decreased appetite as evidenced by per patient/family report, NPO status.  GOAL:  Patient will meet greater than or equal to 90% of their needs  MONITOR:  Diet advancement, Labs  REASON FOR ASSESSMENT:  Malnutrition Screening Tool    ASSESSMENT:  46 y.o. female with hx of tobacco abuse transferred from outside hospital after CT scan showed a foreign body in the sigmoid colon with focal perforation.   Surgery and GI teams consulted. Pt would like to avoid surgery and resection/colostomy. Currently trying to manage conservatively.   Plan is for sigmoidoscopy in OR this afternoon to attempt removal of foreign body. In unsuccessful, pt will undergo bowel resection with colostomy creation.   Attempted to call pt on room phone x 2, no answer at this time. Limited medical history available, noted a 13.2% weight loss in the last 8 months (10/06/20-06/13/21). Pt is currently NPO for procedure this afternoon but did report decreased appetite on admission which is likely related to recent abdominal pain. Will place recommendations for supplements as diet is advanced after procedure.    Nutritionally Relevant Medications: Continuous Infusions:  0.9 % NaCl with KCl 20 mEq / L 150 mL/hr at 06/14/21 0609   piperacillin-tazobactam (ZOSYN)  IV 3.375 g (06/14/21 0529)   PRN Meds: ondansetron  Labs Reviewed: Sodium - 134 Potassium - 3.1 Calcium - 8.5   NUTRITION - FOCUSED PHYSICAL EXAM: Defer to in-person assessment  Diet Order:   Diet Order             Diet NPO time specified Except for: Ice Chips, Sips with Meds  Diet effective now                    EDUCATION NEEDS:  No education needs have been identified at this time  Skin:  Skin Assessment: Reviewed RN Assessment  Last BM:  1/24 per RN documentation  Height:  Ht Readings from Last 1 Encounters:  06/13/21 5' (1.524 m)   Weight:  Wt Readings from Last 1 Encounters:  06/13/21 72.6 kg    Ideal Body Weight:  45.5 kg  BMI:  Body mass index is 31.25 kg/m.  Estimated Nutritional Needs:  Kcal:  1600-1800 kcal/d Protein:  80-90 g/d Fluid:  1.8-2 L/d   Greig Castilla, RD, LDN Clinical Dietitian RD pager # available in [facility]  After hours/weekend pager # available in [facility]

## 2021-06-14 NOTE — Interval H&P Note (Signed)
History and Physical Interval Note: 45/female with retained foreign body and sigmoid perforation for sigmoidoscopy for foreign body removal in OR in presence of general surgery as she may need immediate colectomy and colostomy.  06/14/2021 2:52 PM  Misty Peck  has presented today for surgery, with the diagnosis of FOREIGN BODY SIGMOID COLON.  The various methods of treatment have been discussed with the patient and family. After consideration of risks, benefits and other options for treatment, the patient has consented to  Procedure(s): EXPLORATORY LAPAROTOMY, POSSIBLE SIGMOID COLECTOMY WITH COLOSTOMY (N/A) FLEXIBLE SIGMOIDOSCOPY (N/A) as a surgical intervention.  The patient's history has been reviewed, patient examined, no change in status, stable for surgery.  I have reviewed the patient's chart and labs.  Questions were answered to the patient's satisfaction.     Kerin Salen

## 2021-06-14 NOTE — Anesthesia Procedure Notes (Signed)
Procedure Name: Intubation Date/Time: 06/14/2021 3:40 PM Performed by: Montel Clock, CRNA Pre-anesthesia Checklist: Patient identified, Emergency Drugs available, Suction available, Patient being monitored and Timeout performed Patient Re-evaluated:Patient Re-evaluated prior to induction Oxygen Delivery Method: Circle system utilized Preoxygenation: Pre-oxygenation with 100% oxygen Induction Type: IV induction Ventilation: Mask ventilation without difficulty and Oral airway inserted - appropriate to patient size Laryngoscope Size: Mac and 3 Grade View: Grade II Tube type: Oral Tube size: 7.0 mm Number of attempts: 1 Airway Equipment and Method: Stylet Placement Confirmation: ETT inserted through vocal cords under direct vision, positive ETCO2 and breath sounds checked- equal and bilateral Secured at: 21 cm Tube secured with: Tape Dental Injury: Teeth and Oropharynx as per pre-operative assessment

## 2021-06-14 NOTE — Anesthesia Preprocedure Evaluation (Addendum)
Anesthesia Evaluation  Patient identified by MRN, date of birth, ID band Patient awake    Reviewed: Allergy & Precautions, NPO status , Patient's Chart, lab work & pertinent test results  Airway Mallampati: III  TM Distance: >3 FB Neck ROM: Full    Dental  (+) Poor Dentition, Missing   Pulmonary Current Smoker and Patient abstained from smoking.,    Pulmonary exam normal breath sounds clear to auscultation       Cardiovascular negative cardio ROS Normal cardiovascular exam Rhythm:Regular Rate:Normal  ECG: ST   Neuro/Psych negative neurological ROS  negative psych ROS   GI/Hepatic negative GI ROS, (+)     substance abuse  ,   Endo/Other  negative endocrine ROS  Renal/GU negative Renal ROS     Musculoskeletal negative musculoskeletal ROS (+)   Abdominal (+) + obese,   Peds  Hematology negative hematology ROS (+)   Anesthesia Other Findings FOREIGN BODY SIGMOID COLON  Reproductive/Obstetrics hcg negative                            Anesthesia Physical Anesthesia Plan  ASA: 2  Anesthesia Plan: MAC   Post-op Pain Management:    Induction: Intravenous  PONV Risk Score and Plan: Propofol infusion and Treatment may vary due to age or medical condition  Airway Management Planned: Simple Face Mask  Additional Equipment:   Intra-op Plan:   Post-operative Plan:   Informed Consent: I have reviewed the patients History and Physical, chart, labs and discussed the procedure including the risks, benefits and alternatives for the proposed anesthesia with the patient or authorized representative who has indicated his/her understanding and acceptance.     Dental advisory given  Plan Discussed with: CRNA  Anesthesia Plan Comments: (Will convert to general anesthesia if patient under goes ex-lap)        Anesthesia Quick Evaluation

## 2021-06-14 NOTE — Op Note (Signed)
Operative Note  Pre-operative Diagnosis:  perforated sigmoid colon due to foreign body  Post-operative Diagnosis:  same  Surgeon:  Darnell Level, MD  Assistant:  none   Procedure:  exploratory laparotomy, sigmoid colectomy, descending colostomy  Anesthesia:  general  Estimated Blood Loss:  50 cc  Drains: none         Specimen: sigmoid colon and foreign body to pathology  Indications: Patient is a 46 year old female admitted to the surgical service from [facility] with a foreign body in the sigmoid colon with perforation.  Patient was taken to the operating room for colonoscopy by Dr. Marca Ancona.  The foreign body was visualized but could not be extracted.  The perforation in the colon was identified endoscopically.  Therefore the patient was prepared immediately for laparotomy.  Upon entering the abdomen (organ space), I encountered feculent peritonitis.  CASE DATA:  Type of patient?: LDOW CASE (Surgical Hospitalist [facility] Inpatient)  Status of Case? URGENT Add On  Infection Present At Time Of Surgery (PATOS)?  FECULENT PERITONITIS    Procedure:  The patient was seen in the pre-op holding area. The risks, benefits, complications, treatment options, and expected outcomes were previously discussed with the patient. The patient agreed with the proposed plan and has signed the informed consent form.  The patient was brought to the operating room by the surgical team, identified as Misty Peck and the procedure verified. A "time out" was completed and the above information confirmed.  Patient had been brought to the operating room by the gastroenterology service and had undergone colonoscopy.  I was present for the procedure and able to observe.  A foreign body was identified in the sigmoid colon.  Insufflation of air did not adequately distend the colon because of leakage through a perforation which was identified endoscopically.  A foreign body representing a dental appliance was  identified within the sigmoid colon but could not be extracted.  Therefore the patient was prepared for urgent laparotomy.  After ascertaining that an adequate level of general anesthesia been achieved, the patient was prepped and draped in the usual aseptic fashion.  Using the electrocautery a midline abdominal incision was made.  Dissection was carried through subcutaneous tissues to the fascia.  Fascia was incised in the midline and the peritoneal cavity was entered cautiously.  Cloudy foul-smelling fluid with feculent material was identified in the lower abdomen and pelvis.  Palpation revealed a large inflammatory mass in the mid to distal sigmoid colon.  Balfour retractor was placed for exposure.  Fluid was evacuated from the abdomen.  A point in the proximal sigmoid colon just proximal to the inflammatory mass was selected and transected with a GIA stapler.  Mesentery was divided with the LigaSure.  Dissection was carried distally and the inflammatory mass was mobilized from the retroperitoneum.  Dissection was carried posterior to the inflammatory mass to the proximal rectum.  The mesorectum was divided with the LigaSure.  The proximal rectum was then transected just above a GIA 60 stapler.  The specimen was passed off the field and onto the back table.  It was opened along its length and contained a dental appliance in which the hook of the appliance was embedded in the bowel wall at the site of perforation.  The sigmoid colon and the dental appliance were submitted to pathology for review.  The staple line on the proximal rectum was marked at each end with a Prolene suture.  Abdomen was then irrigated with warm saline.  Good hemostasis was noted.  Nasogastric tube was properly positioned within the stomach.  The proximal sigmoid colon and distal descending colon were mobilized from their lateral peritoneal attachments.  An elliptical incision was made in the left mid abdominal wall.  A plug of  adipose tissue was excised beneath the ellipse of skin down to the fascia.  The skin and subcutaneous tissue were excised and discarded.  The fascia was incised in a cruciate fashion and the peritoneal cavity was entered cautiously.  Using a Babcock clamp the end of the proximal sigmoid colon was delivered through the abdominal wall easily.  The bowel was secured circumferentially to the fascia with interrupted 2-0 silk sutures.  Further irrigation was performed on the abdomen removing as much of the feculent peritonitis as possible.  Good hemostasis was noted.  Bowel was returned to its normal anatomic position and covered with the omentum.  Midline abdominal incision was closed with a running #1 Novafil suture.  Subcutaneous tissues were irrigated.  Next the colostomy is matured by excising the staple line with the electrocautery.  Bowel was matured to the skin edges circumferentially with interrupted 2-0 Vicryl sutures.  A colostomy appliance is applied.  Midline wound is packed with Betadine soaked Kerlix gauze.  This is covered with dry gauze and ABD pads and secured with Medipore tape.  Patient is awakened from anesthesia and transported to the recovery room in stable condition.  The patient tolerated the procedure well.   Darnell Level, MD [facility] Surgery Office: [phone]

## 2021-06-14 NOTE — H&P (View-Only) (Signed)
Referring Provider: TRH °Primary Care Physician:  [facility] °Primary Gastroenterologist:  Unassigned ° °Reason for Consultation:  Lower abdominal pain, foreign  body in sigmoid ° °HPI: Misty Peck is a 46 y.o. female presents for evaluation of abdominal pain and abnormal CT showing foreign body in sigmoid colon with focal perforation. ° °Patient states she has been having lower abdominal pain since Saturday (1/21). It has been intermittent and cramping. Denies melena/hematochezia. Had a BM on Saturday and this morning, both were diarrhea. Denies nausea/vomiting. CT showed foreign body in the sigmoid colon with focal perforation. Patient states she thinks she may have swallowed a partial denture in her sleep about a week ago. Denies family history of colon cancer. Denies previous colonoscopies. Smokes 1 PPD. Social alcohol use. ° °History reviewed. No pertinent past medical history. ° °Past Surgical History:  °Procedure Laterality Date  ° CESAREAN SECTION    ° HERNIA REPAIR    ° ° °Prior to Admission medications   °Medication Sig Start Date End Date Taking? Authorizing Provider  °ibuprofen (ADVIL) 200 MG tablet Take 600 mg by mouth every 6 (six) hours as needed (for pain).   Yes [provider]  °levonorgestrel (MIRENA) 20 MCG/DAY IUD 1 each by Intrauterine route once.   Yes [provider]  ° ° °Scheduled Meds: ° enoxaparin (LOVENOX) injection  40 mg Subcutaneous Q24H  ° °Continuous Infusions: ° 0.9 % NaCl with KCl 20 mEq / L 150 mL/hr at 06/14/21 0609  ° piperacillin-tazobactam (ZOSYN)  IV 3.375 g (06/14/21 0529)  ° °PRN Meds:.acetaminophen, HYDROmorphone (DILAUDID) injection, ondansetron **OR** ondansetron (ZOFRAN) IV ° °Allergies as of 06/13/2021  ° (No Known Allergies)  ° ° °History reviewed. No pertinent family history. ° °Social History  ° °Socioeconomic History  ° Marital status: Married  °  Spouse name: Not on file  ° Number of children: Not on file  ° Years of  education: Not on file  ° Highest education level: Not on file  °Occupational History  ° Not on file  °Tobacco Use  ° Smoking status: Every Day  °  Packs/day: 1.00  °  Types: Cigarettes  °  Passive exposure: Never  ° Smokeless tobacco: Never  °Substance and Sexual Activity  ° Alcohol use: Yes  °  Comment: occ  ° Drug use: Never  ° Sexual activity: Not on file  °Other Topics Concern  ° Not on file  °Social History Narrative  ° Not on file  ° °Social Determinants of Health  ° °Financial Resource Strain: Not on file  °Food Insecurity: Not on file  °Transportation Needs: Not on file  °Physical Activity: Not on file  °Stress: Not on file  °Social Connections: Not on file  °Intimate Partner Violence: Not on file  ° ° °Review of Systems: Review of Systems  °Constitutional:  Positive for chills and fever.  °HENT:  Negative for hearing loss and tinnitus.   °Eyes:  Negative for blurred vision.  °Respiratory:  Negative for cough and hemoptysis.   °Cardiovascular:  Negative for chest pain and palpitations.  °Gastrointestinal:  Positive for abdominal pain and diarrhea. Negative for blood in stool, constipation, heartburn, melena, nausea and vomiting.  °Genitourinary:  Negative for dysuria and urgency.  °Musculoskeletal:  Negative for myalgias and neck pain.  °Skin:  Negative for itching and rash.  °Neurological:  Negative for seizures and loss of consciousness.  °Psychiatric/Behavioral:  Negative for substance abuse. The patient is not nervous/anxious.    ° °Physical Exam:Physical   Exam °Constitutional:   °   Appearance: She is obese.  °HENT:  °   Head: Normocephalic and atraumatic.  °   Nose: Nose normal. No congestion.  °   Mouth/Throat:  °   Mouth: Mucous membranes are moist.  °   Pharynx: Oropharynx is clear.  °Eyes:  °   Conjunctiva/sclera: Conjunctivae normal.  °Cardiovascular:  °   Rate and Rhythm: Normal rate and regular rhythm.  °Pulmonary:  °   Effort: Pulmonary effort is normal. No respiratory distress.  °Abdominal:  °    General: Abdomen is flat. Bowel sounds are normal. There is no distension.  °   Palpations: Abdomen is soft. There is no mass.  °   Tenderness: There is abdominal tenderness (lower abdomen). There is no guarding or rebound.  °   Hernia: No hernia is present.  °Musculoskeletal:     °   General: No swelling. Normal range of motion.  °   Cervical back: Normal range of motion and neck supple.  °Skin: °   General: Skin is warm and dry.  °Neurological:  °   General: No focal deficit present.  °   Mental Status: She is alert and oriented to person, place, and time.  °Psychiatric:     °   Mood and Affect: Mood normal.     °   Behavior: Behavior normal.     °   Thought Content: Thought content normal.     °   Judgment: Judgment normal.  °  °Vital signs: °Vitals:  ° 06/14/21 0214 06/14/21 0644  °BP: (!) 156/101 (!) 152/87  °Pulse: (!) 106 (!) 104  °Resp: 18 18  °Temp: 98.3 °F (36.8 °C) 98.5 °F (36.9 °C)  °SpO2: 97% 96%  ° °Last BM Date: 06/14/21 ° ° ° °GI:  °Lab Results: °Recent Labs  °  06/13/21 °1700 06/14/21 °0317  °WBC 23.6* 18.5*  °HGB 16.6* 14.8  °HCT 47.0* 43.7  °PLT 272 250  ° °BMET °Recent Labs  °  06/13/21 °1700 06/14/21 °0317  °NA 131* 134*  °K 3.0* 3.1*  °CL 92* 99  °CO2 23 22  °GLUCOSE 121* 99  °BUN 16 17  °CREATININE 0.79 0.61  °CALCIUM 9.2 8.5*  ° °LFT °Recent Labs  °  06/13/21 °1700  °PROT 8.4*  °ALBUMIN 3.6  °AST 16  °ALT 13  °ALKPHOS 118  °BILITOT 1.2  ° °PT/INR °Recent Labs  °  06/13/21 °1700  °LABPROT 13.9  °INR 1.1  ° ° ° °Studies/Results: °CT ABDOMEN PELVIS W CONTRAST ° °Result Date: 06/13/2021 °CLINICAL DATA:  Bilateral lower quadrant pain and fever. Patient reports right-sided pelvic pain since Friday. EXAM: CT ABDOMEN AND PELVIS WITH CONTRAST TECHNIQUE: Multidetector CT imaging of the abdomen and pelvis was performed using the standard protocol following bolus administration of intravenous contrast. RADIATION DOSE REDUCTION: This exam was performed according to the departmental dose-optimization  program which includes automated exposure control, adjustment of the mA and/or kV according to patient size and/or use of iterative reconstruction technique. CONTRAST:  100mL OMNIPAQUE IOHEXOL 300 MG/ML  SOLN COMPARISON:  None. FINDINGS: Lower chest: No acute airspace disease or pleural effusion. Hepatobiliary: No focal liver abnormality is seen. Possible but not definite gallstone. No pericholecystic fat stranding or inflammation. No biliary dilatation. Pancreas: No ductal dilatation or inflammation. Spleen: Normal in size without focal abnormality. Small cleft posteriorly. Adrenals/Urinary Tract: No adrenal nodule. No hydronephrosis or perinephric edema. Homogeneous renal enhancement with symmetric excretion on delayed phase imaging. No   renal calculi or focal renal lesion. Urinary bladder is near completely empty. Stomach/Bowel: There is a linear metallic density in the sigmoid colon. This spans approximately 2.9 cm and is hooked at the end. There is associated sigmoid bowel perforation with mottled extraluminal air, colonic wall thickening, and fat stranding. Small amount of non organized trace fluid. This is in the pelvis just to the left of midline. Free air tracks into the left upper quadrant. No drainable fluid collection. The appendix is visualized and is normal. Small volume of colonic stool. Occasional fluid-filled loops of small bowel, mildly edematous in the pelvis, likely reactive. No small bowel obstruction. Stomach is decompressed. Vascular/Lymphatic: Aortic atherosclerosis. No aortic aneurysm. There is no portal venous or mesenteric gas. Scattered prominent mesenteric lymph nodes, typically reactive. Few prominent inguinal nodes. Reproductive: T-shaped IUD in the uterus.  No adnexal mass. Other: Mild free air in the pelvis with small amount of non organized free fluid. Patchy soft tissue gas tracks into the left upper abdomen. No drainable collection. Musculoskeletal: There are no acute or suspicious  osseous abnormalities. IMPRESSION: 1. Linear metallic density in the sigmoid colon spanning approximately 2.9 cm and hooked at the end, consistent with foreign body. There is associated sigmoid bowel perforation with mottled extraluminal air, colonic wall thickening, none organized trace fluid and fat stranding. Free air tracks into the left upper quadrant of the abdomen. No drainable fluid collection. 2. Possible but not definite gallstone. No evidence of acute cholecystitis. Aortic Atherosclerosis (ICD10-I70.0). Critical Value/emergent results were called by telephone at the time of interpretation on 06/13/2021 at 6:31 pm to provider ADAM CURATOLO , who verbally acknowledged these results. Electronically Signed   By: Melanie  Sanford M.D.   On: 06/13/2021 18:32  ° °DG Chest Port 1 View ° °Result Date: 06/13/2021 °CLINICAL DATA:  Right-sided pelvic pain dark urine EXAM: PORTABLE CHEST 1 VIEW COMPARISON:  None. FINDINGS: The heart size and mediastinal contours are within normal limits. Both lungs are clear. The visualized skeletal structures are unremarkable. IMPRESSION: No active disease. Electronically Signed   By: Kim  Fujinaga M.D.   On: 06/13/2021 17:37   ° °Impression: °Lower abdominal pain, foreign body in sigmoid colon with focal perforation °- CT ab/pelvis with contrast 1/23: foreign body in sigmoid colon with associated focal perforation. Gallstones. °- Leukocytosis with WBC 18.5 °- hgb 14.8 °- K 3.1 ° °Plan: °Plan for Colonoscopy today. with attempt to remove foreign object. Though with focal perforation, procedure is of significantly higher risk. Appreciate general surgery's assistance to be on standby for complications. °I thoroughly discussed the procedures to include nature, alternatives, benefits, and risks including but not limited to bleeding, perforation, infection, anesthesia/cardiac and pulmonary complications. Patient provides understanding and gave verbal consent to proceed. ° Continue  supportive care °[facility] will follow   ° ° ° LOS: 1 day  ° °Alicea Wente M Emric Kowalewski  PA-C °06/14/2021, 10:46 AM ° °Contact #  [phone]  °

## 2021-06-14 NOTE — Transfer of Care (Signed)
Immediate Anesthesia Transfer of Care Note  Patient: Misty Peck  Procedure(s) Performed: EXPLORATORY LAPAROTOMY,  SIGMOID COLECTOMY WITH COLOSTOMY FLEXIBLE SIGMOIDOSCOPY  Patient Location: PACU  Anesthesia Type:General  Level of Consciousness: drowsy and patient cooperative  Airway & Oxygen Therapy: Patient Spontanous Breathing and Patient connected to face mask oxygen  Post-op Assessment: Report given to RN and Post -op Vital signs reviewed and stable  Post vital signs: Reviewed and stable  Last Vitals:  Vitals Value Taken Time  BP 181/99 06/14/21 1752  Temp    Pulse 86 06/14/21 1757  Resp 15 06/14/21 1757  SpO2 96 % 06/14/21 1757  Vitals shown include unvalidated device data.  Last Pain:  Vitals:   06/14/21 1403  TempSrc: Oral  PainSc: 2          Complications: No notable events documented.

## 2021-06-14 NOTE — Progress Notes (Signed)
°  Transition of Care [facility]) Screening Note   Patient Details  Name: Misty Peck Date of Birth: 11-01-1975   Transition of Care [facility]) CM/SW Contact:    Amada Jupiter, LCSW Phone Number: 06/14/2021, 11:29 AM    Transition of Care Department [facility]) has reviewed patient and no TOC needs have been identified at this time. We will continue to monitor patient advancement through interdisciplinary progression rounds. If new patient transition needs arise, please place a TOC consult.

## 2021-06-14 NOTE — Progress Notes (Signed)
Pharmacy Antibiotic Note  Misty Peck is a 46 y.o. female admitted on 06/13/2021 with abdominal pain and abnormal CT showing foreign body in sigmoid colon.  Patient underwent  exploratory laparotomy, sigmoid colectomy, and descending colostomy for perforated sigmoid colon.  Per op note, feculent peritonitis was present. Pharmacy has been consulted for zosyn dosing.  WBC 23.6 > 18.5, Tm 99.1  Plan: Zosyn 3.375g IV q8h (4 hour infusion). F/u clinical improvement, antibiotic length of therapy  Height: 5' (152.4 cm) Weight: 72.6 kg (160 lb) IBW/kg (Calculated) : 45.5  Temp (24hrs), Avg:98.4 F (36.9 C), Min:97.7 F (36.5 C), Max:99.1 F (37.3 C)  Recent Labs  Lab 06/13/21 1700 06/14/21 0317  WBC 23.6* 18.5*  CREATININE 0.79 0.61  LATICACIDVEN 1.5  --     Estimated Creatinine Clearance: 78.1 mL/min (by C-G formula based on SCr of 0.61 mg/dL).    No Known Allergies  Antimicrobials this admission: Zosyn 1/24 >>  CTX/Flagyl x 1  Dose adjustments this admission:   Microbiology results: 1/23 BCx: ngtd 1/23 UCx:    Thank you for allowing pharmacy to be a part of this patients care.  Dimple Nanas, PharmD 06/14/2021 7:35 PM

## 2021-06-14 NOTE — Consult Note (Signed)
Referring Provider: [facility] Primary Care Physician:  [facility] Primary Gastroenterologist:  Althia Forts  Reason for Consultation:  Lower abdominal pain, foreign  body in sigmoid  HPI: Misty Peck is a 46 y.o. female presents for evaluation of abdominal pain and abnormal CT showing foreign body in sigmoid colon with focal perforation.  Patient states she has been having lower abdominal pain since Saturday (1/21). It has been intermittent and cramping. Denies melena/hematochezia. Had a BM on Saturday and this morning, both were diarrhea. Denies nausea/vomiting. CT showed foreign body in the sigmoid colon with focal perforation. Patient states she thinks she may have swallowed a partial denture in her sleep about a week ago. Denies family history of colon cancer. Denies previous colonoscopies. Smokes 1 PPD. Social alcohol use.  History reviewed. No pertinent past medical history.  Past Surgical History:  Procedure Laterality Date   CESAREAN SECTION     HERNIA REPAIR      Prior to Admission medications   Medication Sig Start Date End Date Taking? Authorizing Provider  ibuprofen (ADVIL) 200 MG tablet Take 600 mg by mouth every 6 (six) hours as needed (for pain).   Yes [provider]  levonorgestrel (MIRENA) 20 MCG/DAY IUD 1 each by Intrauterine route once.   Yes [provider]    Scheduled Meds:  enoxaparin (LOVENOX) injection  40 mg Subcutaneous Q24H   Continuous Infusions:  0.9 % NaCl with KCl 20 mEq / L 150 mL/hr at 06/14/21 0609   piperacillin-tazobactam (ZOSYN)  IV 3.375 g (06/14/21 0529)   PRN Meds:.acetaminophen, HYDROmorphone (DILAUDID) injection, ondansetron **OR** ondansetron (ZOFRAN) IV  Allergies as of 06/13/2021   (No Known Allergies)    History reviewed. No pertinent family history.  Social History   Socioeconomic History   Marital status: Married    Spouse name: Not on file   Number of children: Not on file   Years of  education: Not on file   Highest education level: Not on file  Occupational History   Not on file  Tobacco Use   Smoking status: Every Day    Packs/day: 1.00    Types: Cigarettes    Passive exposure: Never   Smokeless tobacco: Never  Substance and Sexual Activity   Alcohol use: Yes    Comment: occ   Drug use: Never   Sexual activity: Not on file  Other Topics Concern   Not on file  Social History Narrative   Not on file   Social Determinants of Health   Financial Resource Strain: Not on file  Food Insecurity: Not on file  Transportation Needs: Not on file  Physical Activity: Not on file  Stress: Not on file  Social Connections: Not on file  Intimate Partner Violence: Not on file    Review of Systems: Review of Systems  Constitutional:  Positive for chills and fever.  HENT:  Negative for hearing loss and tinnitus.   Eyes:  Negative for blurred vision.  Respiratory:  Negative for cough and hemoptysis.   Cardiovascular:  Negative for chest pain and palpitations.  Gastrointestinal:  Positive for abdominal pain and diarrhea. Negative for blood in stool, constipation, heartburn, melena, nausea and vomiting.  Genitourinary:  Negative for dysuria and urgency.  Musculoskeletal:  Negative for myalgias and neck pain.  Skin:  Negative for itching and rash.  Neurological:  Negative for seizures and loss of consciousness.  Psychiatric/Behavioral:  Negative for substance abuse. The patient is not nervous/anxious.     Physical Exam:Physical  Exam Constitutional:      Appearance: She is obese.  HENT:     Head: Normocephalic and atraumatic.     Nose: Nose normal. No congestion.     Mouth/Throat:     Mouth: Mucous membranes are moist.     Pharynx: Oropharynx is clear.  Eyes:     Conjunctiva/sclera: Conjunctivae normal.  Cardiovascular:     Rate and Rhythm: Normal rate and regular rhythm.  Pulmonary:     Effort: Pulmonary effort is normal. No respiratory distress.  Abdominal:      General: Abdomen is flat. Bowel sounds are normal. There is no distension.     Palpations: Abdomen is soft. There is no mass.     Tenderness: There is abdominal tenderness (lower abdomen). There is no guarding or rebound.     Hernia: No hernia is present.  Musculoskeletal:        General: No swelling. Normal range of motion.     Cervical back: Normal range of motion and neck supple.  Skin:    General: Skin is warm and dry.  Neurological:     General: No focal deficit present.     Mental Status: She is alert and oriented to person, place, and time.  Psychiatric:        Mood and Affect: Mood normal.        Behavior: Behavior normal.        Thought Content: Thought content normal.        Judgment: Judgment normal.    Vital signs: Vitals:   06/14/21 0214 06/14/21 0644  BP: (!) 156/101 (!) 152/87  Pulse: (!) 106 (!) 104  Resp: 18 18  Temp: 98.3 F (36.8 C) 98.5 F (36.9 C)  SpO2: 97% 96%   Last BM Date: 06/14/21    GI:  Lab Results: Recent Labs    06/13/21 1700 06/14/21 0317  WBC 23.6* 18.5*  HGB 16.6* 14.8  HCT 47.0* 43.7  PLT 272 250   BMET Recent Labs    06/13/21 1700 06/14/21 0317  NA 131* 134*  K 3.0* 3.1*  CL 92* 99  CO2 23 22  GLUCOSE 121* 99  BUN 16 17  CREATININE 0.79 0.61  CALCIUM 9.2 8.5*   LFT Recent Labs    06/13/21 1700  PROT 8.4*  ALBUMIN 3.6  AST 16  ALT 13  ALKPHOS 118  BILITOT 1.2   PT/INR Recent Labs    06/13/21 1700  LABPROT 13.9  INR 1.1     Studies/Results: CT ABDOMEN PELVIS W CONTRAST  Result Date: 06/13/2021 CLINICAL DATA:  Bilateral lower quadrant pain and fever. Patient reports right-sided pelvic pain since Friday. EXAM: CT ABDOMEN AND PELVIS WITH CONTRAST TECHNIQUE: Multidetector CT imaging of the abdomen and pelvis was performed using the standard protocol following bolus administration of intravenous contrast. RADIATION DOSE REDUCTION: This exam was performed according to the departmental dose-optimization  program which includes automated exposure control, adjustment of the mA and/or kV according to patient size and/or use of iterative reconstruction technique. CONTRAST:  190mL OMNIPAQUE IOHEXOL 300 MG/ML  SOLN COMPARISON:  None. FINDINGS: Lower chest: No acute airspace disease or pleural effusion. Hepatobiliary: No focal liver abnormality is seen. Possible but not definite gallstone. No pericholecystic fat stranding or inflammation. No biliary dilatation. Pancreas: No ductal dilatation or inflammation. Spleen: Normal in size without focal abnormality. Small cleft posteriorly. Adrenals/Urinary Tract: No adrenal nodule. No hydronephrosis or perinephric edema. Homogeneous renal enhancement with symmetric excretion on delayed phase imaging. No  renal calculi or focal renal lesion. Urinary bladder is near completely empty. Stomach/Bowel: There is a linear metallic density in the sigmoid colon. This spans approximately 2.9 cm and is hooked at the end. There is associated sigmoid bowel perforation with mottled extraluminal air, colonic wall thickening, and fat stranding. Small amount of non organized trace fluid. This is in the pelvis just to the left of midline. Free air tracks into the left upper quadrant. No drainable fluid collection. The appendix is visualized and is normal. Small volume of colonic stool. Occasional fluid-filled loops of small bowel, mildly edematous in the pelvis, likely reactive. No small bowel obstruction. Stomach is decompressed. Vascular/Lymphatic: Aortic atherosclerosis. No aortic aneurysm. There is no portal venous or mesenteric gas. Scattered prominent mesenteric lymph nodes, typically reactive. Few prominent inguinal nodes. Reproductive: T-shaped IUD in the uterus.  No adnexal mass. Other: Mild free air in the pelvis with small amount of non organized free fluid. Patchy soft tissue gas tracks into the left upper abdomen. No drainable collection. Musculoskeletal: There are no acute or suspicious  osseous abnormalities. IMPRESSION: 1. Linear metallic density in the sigmoid colon spanning approximately 2.9 cm and hooked at the end, consistent with foreign body. There is associated sigmoid bowel perforation with mottled extraluminal air, colonic wall thickening, none organized trace fluid and fat stranding. Free air tracks into the left upper quadrant of the abdomen. No drainable fluid collection. 2. Possible but not definite gallstone. No evidence of acute cholecystitis. Aortic Atherosclerosis (ICD10-I70.0). Critical Value/emergent results were called by telephone at the time of interpretation on 06/13/2021 at 6:31 pm to provider ADAM CURATOLO , who verbally acknowledged these results. Electronically Signed   By: Keith Rake M.D.   On: 06/13/2021 18:32   DG Chest Port 1 View  Result Date: 06/13/2021 CLINICAL DATA:  Right-sided pelvic pain dark urine EXAM: PORTABLE CHEST 1 VIEW COMPARISON:  None. FINDINGS: The heart size and mediastinal contours are within normal limits. Both lungs are clear. The visualized skeletal structures are unremarkable. IMPRESSION: No active disease. Electronically Signed   By: Donavan Foil M.D.   On: 06/13/2021 17:37    Impression: Lower abdominal pain, foreign body in sigmoid colon with focal perforation - CT ab/pelvis with contrast 1/23: foreign body in sigmoid colon with associated focal perforation. Gallstones. - Leukocytosis with WBC 18.5 - hgb 14.8 - K 3.1  Plan: Plan for Colonoscopy today. with attempt to remove foreign object. Though with focal perforation, procedure is of significantly higher risk. Appreciate general surgery's assistance to be on standby for complications. I thoroughly discussed the procedures to include nature, alternatives, benefits, and risks including but not limited to bleeding, perforation, infection, anesthesia/cardiac and pulmonary complications. Patient provides understanding and gave verbal consent to proceed.  Continue  supportive care [facility] will follow      LOS: 1 day   Garnette Scheuermann  PA-C 06/14/2021, 10:46 AM  Contact #  [phone]

## 2021-06-15 ENCOUNTER — Encounter (HOSPITAL_COMMUNITY): Payer: Self-pay | Admitting: Surgery

## 2021-06-15 LAB — CBC
HCT: 41.4 % (ref 36.0–46.0)
Hemoglobin: 13.8 g/dL (ref 12.0–15.0)
MCH: 33 pg (ref 26.0–34.0)
MCHC: 33.3 g/dL (ref 30.0–36.0)
MCV: 99 fL (ref 80.0–100.0)
Platelets: 266 10*3/uL (ref 150–400)
RBC: 4.18 MIL/uL (ref 3.87–5.11)
RDW: 14.1 % (ref 11.5–15.5)
WBC: 18.3 10*3/uL — ABNORMAL HIGH (ref 4.0–10.5)
nRBC: 0 % (ref 0.0–0.2)

## 2021-06-15 LAB — URINE CULTURE

## 2021-06-15 LAB — BASIC METABOLIC PANEL
Anion gap: 9 (ref 5–15)
BUN: 9 mg/dL (ref 6–20)
CO2: 22 mmol/L (ref 22–32)
Calcium: 7.9 mg/dL — ABNORMAL LOW (ref 8.9–10.3)
Chloride: 104 mmol/L (ref 98–111)
Creatinine, Ser: 0.52 mg/dL (ref 0.44–1.00)
GFR, Estimated: >60 mL/min (ref >60)
Glucose, Bld: 183 mg/dL — ABNORMAL HIGH (ref 70–99)
Potassium: 3.6 mmol/L (ref 3.5–5.1)
Sodium: 135 mmol/L (ref 135–145)

## 2021-06-15 MED ORDER — METHOCARBAMOL 1000 MG/10ML IJ SOLN
500.0000 mg | Freq: Four times a day (QID) | INTRAVENOUS | Status: DC | PRN
  Filled 2021-06-15: qty 5

## 2021-06-15 MED ORDER — ACETAMINOPHEN 10 MG/ML IV SOLN
1000.0000 mg | Freq: Four times a day (QID) | INTRAVENOUS | Status: AC
  Administered 2021-06-15 – 2021-06-16 (×4): 1000 mg via INTRAVENOUS
  Filled 2021-06-15 (×4): qty 100

## 2021-06-15 MED ORDER — NICOTINE 21 MG/24HR TD PT24
21.0000 mg | MEDICATED_PATCH | Freq: Every day | TRANSDERMAL | Status: DC
  Administered 2021-06-15 – 2021-06-22 (×8): 21 mg via TRANSDERMAL
  Filled 2021-06-15 (×8): qty 1

## 2021-06-15 MED ORDER — HYDRALAZINE HCL 20 MG/ML IJ SOLN
10.0000 mg | Freq: Four times a day (QID) | INTRAMUSCULAR | Status: DC | PRN
  Administered 2021-06-17 – 2021-06-21 (×4): 10 mg via INTRAVENOUS
  Filled 2021-06-15 (×4): qty 1

## 2021-06-15 MED ORDER — METOPROLOL TARTRATE 5 MG/5ML IV SOLN
5.0000 mg | INTRAVENOUS | Status: DC | PRN
  Administered 2021-06-17 (×2): 5 mg via INTRAVENOUS
  Filled 2021-06-15 (×2): qty 5

## 2021-06-15 NOTE — Progress Notes (Signed)
Subjective: Denies abdominal pain. Has an NG tube draining bilious fluid, denies nausea. Denies fevers overnight.  Objective: Vital signs in last 24 hours: Temp:  [97.6 F (36.4 C)-99.1 F (37.3 C)] 98.4 F (36.9 C) (01/25 0500) Pulse Rate:  [81-98] 90 (01/25 0500) Resp:  [16-20] 17 (01/25 0500) BP: (151-197)/(80-111) 151/96 (01/25 0500) SpO2:  [95 %-99 %] 99 % (01/25 0500) Weight:  [72.6 kg] 72.6 kg (01/24 1403) Weight change: 0 kg Last BM Date: 06/14/21  PE: NG tube draining bilious fluid GENERAL: Not in distress  ABDOMEN: Abdominal wound noted postsurgery, packed with gauze EXTREMITIES: No deformity  Lab Results: Results for orders placed or performed during the hospital encounter of 06/13/21 (from the past 48 hour(s))  Resp Panel by RT-PCR (Flu A&B, Covid) Nasopharyngeal Swab     Status: None   Collection Time: 06/13/21  4:59 PM   Specimen: Nasopharyngeal Swab; Nasopharyngeal(NP) swabs in vial transport medium  Result Value Ref Range   SARS Coronavirus 2 by RT PCR NEGATIVE NEGATIVE    Comment: (NOTE) SARS-CoV-2 target nucleic acids are NOT DETECTED.  The SARS-CoV-2 RNA is generally detectable in upper respiratory specimens during the acute phase of infection. The lowest concentration of SARS-CoV-2 viral copies this assay can detect is 138 copies/mL. A negative result does not preclude SARS-Cov-2 infection and should not be used as the sole basis for treatment or other patient management decisions. A negative result may occur with  improper specimen collection/handling, submission of specimen other than nasopharyngeal swab, presence of viral mutation(s) within the areas targeted by this assay, and inadequate number of viral copies(<138 copies/mL). A negative result must be combined with clinical observations, patient history, and epidemiological information. The expected result is Negative.  Fact Sheet for Patients:  BloggerCourse.comhttps://www.fda.gov/media/152166/download  Fact  Sheet for Healthcare Providers:  SeriousBroker.ithttps://www.fda.gov/media/152162/download  This test is no t yet approved or cleared by the [place] FDA and  has been authorized for detection and/or diagnosis of SARS-CoV-2 by FDA under an Emergency Use Authorization (EUA). This EUA will remain  in effect (meaning this test can be used) for the duration of the COVID-19 declaration under Section 564(b)(1) of the Act, 21 U.S.C.section 360bbb-3(b)(1), unless the authorization is terminated  or revoked sooner.       Influenza A by PCR NEGATIVE NEGATIVE   Influenza B by PCR NEGATIVE NEGATIVE    Comment: (NOTE) The Xpert Xpress SARS-CoV-2/FLU/RSV plus assay is intended as an aid in the diagnosis of influenza from Nasopharyngeal swab specimens and should not be used as a sole basis for treatment. Nasal washings and aspirates are unacceptable for Xpert Xpress SARS-CoV-2/FLU/RSV testing.  Fact Sheet for Patients: BloggerCourse.comhttps://www.fda.gov/media/152166/download  Fact Sheet for Healthcare Providers: SeriousBroker.ithttps://www.fda.gov/media/152162/download  This test is not yet approved or cleared by the [place] FDA and has been authorized for detection and/or diagnosis of SARS-CoV-2 by FDA under an Emergency Use Authorization (EUA). This EUA will remain in effect (meaning this test can be used) for the duration of the COVID-19 declaration under Section 564(b)(1) of the Act, 21 U.S.C. section 360bbb-3(b)(1), unless the authorization is terminated or revoked.  Performed at [facility] [place], [address] [place], [place] [address]   Urinalysis, Routine w reflex microscopic Urine, Clean Catch     Status: Abnormal   Collection Time: 06/13/21  4:59 PM  Result Value Ref Range   Color, Urine AMBER (A) YELLOW    Comment: BIOCHEMICALS MAY BE AFFECTED BY COLOR   APPearance CLOUDY (A) CLEAR  Specific Gravity, Urine >1.030 (H) 1.005 - 1.030   pH 6.0 5.0 - 8.0   Glucose, UA NEGATIVE NEGATIVE mg/dL   Hgb  urine dipstick SMALL (A) NEGATIVE   Bilirubin Urine MODERATE (A) NEGATIVE   Ketones, ur 80 (A) NEGATIVE mg/dL   Protein, ur 737 (A) NEGATIVE mg/dL   Nitrite NEGATIVE NEGATIVE   Leukocytes,Ua NEGATIVE NEGATIVE    Comment: Performed at [facility], [address] [place], [place] [address]  Urinalysis, Microscopic (reflex)     Status: Abnormal   Collection Time: 06/13/21  4:59 PM  Result Value Ref Range   RBC / HPF 0-5 0 - 5 RBC/hpf   WBC, UA 0-5 0 - 5 WBC/hpf   Bacteria, UA MANY (A) NONE SEEN   Squamous Epithelial / LPF 0-5 0 - 5   Mucus PRESENT     Comment: Performed at [facility], [address] [place], [place] [address]  Lactic acid, plasma     Status: None   Collection Time: 06/13/21  5:00 PM  Result Value Ref Range   Lactic Acid, Venous 1.5 0.5 - 1.9 mmol/L    Comment: Performed at [facility], [address] [place], [place] [address]  Comprehensive metabolic panel     Status: Abnormal   Collection Time: 06/13/21  5:00 PM  Result Value Ref Range   Sodium 131 (L) 135 - 145 mmol/L   Potassium 3.0 (L) 3.5 - 5.1 mmol/L   Chloride 92 (L) 98 - 111 mmol/L   CO2 23 22 - 32 mmol/L   Glucose, Bld 121 (H) 70 - 99 mg/dL    Comment: Glucose reference range applies only to samples taken after fasting for at least 8 hours.   BUN 16 6 - 20 mg/dL   Creatinine, Ser 5.00 0.44 - 1.00 mg/dL   Calcium 9.2 8.9 - 93.8 mg/dL   Total Protein 8.4 (H) 6.5 - 8.1 g/dL   Albumin 3.6 3.5 - 5.0 g/dL   AST 16 15 - 41 U/L   ALT 13 0 - 44 U/L   Alkaline Phosphatase 118 38 - 126 U/L   Total Bilirubin 1.2 0.3 - 1.2 mg/dL   GFR, Estimated >18 >29 mL/min    Comment: (NOTE) Calculated using the CKD-EPI Creatinine Equation (2021)    Anion gap 16 (H) 5 - 15    Comment: Performed at [facility], [address] [place], [place] [address]  CBC WITH DIFFERENTIAL     Status: Abnormal   Collection Time: 06/13/21  5:00 PM  Result Value Ref Range   WBC 23.6 (H)  4.0 - 10.5 K/uL   RBC 4.97 3.87 - 5.11 MIL/uL   Hemoglobin 16.6 (H) 12.0 - 15.0 g/dL   HCT 96.7 (H) 89.3 - 81.0 %   MCV 94.6 80.0 - 100.0 fL   MCH 33.4 26.0 - 34.0 pg   MCHC 35.3 30.0 - 36.0 g/dL   RDW 17.5 10.2 - 58.5 %   Platelets 272 150 - 400 K/uL   nRBC 0.0 0.0 - 0.2 %   Neutrophils Relative % 89 %   Neutro Abs 20.9 (H) 1.7 - 7.7 K/uL   Lymphocytes Relative 5 %   Lymphs Abs 1.2 0.7 - 4.0 K/uL   Monocytes Relative 5 %   Monocytes Absolute 1.3 (H) 0.1 - 1.0 K/uL   Eosinophils Relative 0 %   Eosinophils Absolute 0.0 0.0 - 0.5 K/uL   Basophils  Relative 0 %   Basophils Absolute 0.1 0.0 - 0.1 K/uL   Immature Granulocytes 1 %   Abs Immature Granulocytes 0.13 (H) 0.00 - 0.07 K/uL    Comment: Performed at [facility] [place], [address] [place], [place] [address]  Protime-INR     Status: None   Collection Time: 06/13/21  5:00 PM  Result Value Ref Range   Prothrombin Time 13.9 11.4 - 15.2 seconds   INR 1.1 0.8 - 1.2    Comment: (NOTE) INR goal varies based on device and disease states. Performed at [facility] [place], [address] [place] [address] [place], [place] [address]   APTT     Status: None   Collection Time: 06/13/21  5:00 PM  Result Value Ref Range   aPTT 25 24 - 36 seconds    Comment: Performed at [facility] [place], [address] [place], [place] [address]  Urine Culture     Status: Abnormal   Collection Time: 06/13/21  5:00 PM   Specimen: In/Out Cath Urine  Result Value Ref Range   Specimen Description      IN/OUT CATH URINE Performed at [facility] [place], [address] [place], [place] [address]    Special Requests      NONE Performed at [facility] [place], [address] [place], [place] [address]    Culture MULTIPLE SPECIES PRESENT, SUGGEST RECOLLECTION (A)    Report Status 06/15/2021 FINAL   Pregnancy, urine     Status: None   Collection Time: 06/13/21  5:01 PM  Result Value Ref Range   Preg Test, Ur NEGATIVE NEGATIVE    Comment:         THE SENSITIVITY OF THIS METHODOLOGY IS >20 mIU/mL. Performed at [facility] [place], [address] [place], [place] [address]   Blood Culture (routine x 2)     Status: None (Preliminary result)   Collection Time: 06/13/21  5:20 PM   Specimen: BLOOD  Result Value Ref Range   Specimen Description      BLOOD RIGHT ANTECUBITAL Performed at [facility] [place], [address] [place], [place] [address]    Special Requests      BOTTLES DRAWN AEROBIC AND ANAEROBIC Blood Culture adequate volume Performed at [facility] [place], [address] [place], [place] [address]    Culture      NO GROWTH 2 DAYS Performed at [facility], [address] [place], [place] [address]    Report Status PENDING   Blood Culture (routine x 2)     Status: None (Preliminary result)   Collection Time: 06/13/21  5:28 PM   Specimen: BLOOD  Result Value Ref Range   Specimen Description      BLOOD BLOOD LEFT FOREARM Performed at [facility] [place], [address] [place], [place] [address]    Special Requests      BOTTLES DRAWN AEROBIC AND ANAEROBIC Blood Culture adequate volume Performed at [facility] [place], [address] [place], [place] [address]    Culture      NO GROWTH 2 DAYS Performed at [facility], [address] [place], [place] [address]    Report Status PENDING   Basic metabolic panel     Status: Abnormal   Collection Time: 06/14/21  3:17 AM  Result Value Ref Range   Sodium 134 (L) 135 - 145 mmol/L  Potassium 3.1 (L) 3.5 - 5.1 mmol/L   Chloride 99 98 - 111 mmol/L   CO2 22 22 - 32 mmol/L   Glucose, Bld 99 70 - 99 mg/dL    Comment: Glucose reference range applies only to samples taken after fasting for at least 8 hours.   BUN 17 6 - 20 mg/dL   Creatinine, Ser 2.67 0.44 - 1.00 mg/dL   Calcium 8.5 (L) 8.9 - 10.3 mg/dL   GFR, Estimated >12 >45 mL/min    Comment: (NOTE) Calculated using the CKD-EPI Creatinine Equation (2021)    Anion gap  13 5 - 15    Comment: Performed at [facility], [address], [place], [place] [address]  CBC     Status: Abnormal   Collection Time: 06/14/21  3:17 AM  Result Value Ref Range   WBC 18.5 (H) 4.0 - 10.5 K/uL   RBC 4.46 3.87 - 5.11 MIL/uL   Hemoglobin 14.8 12.0 - 15.0 g/dL   HCT 33.8 25.0 - 53.9 %   MCV 98.0 80.0 - 100.0 fL   MCH 33.2 26.0 - 34.0 pg   MCHC 33.9 30.0 - 36.0 g/dL   RDW 76.7 34.1 - 93.7 %   Platelets 250 150 - 400 K/uL   nRBC 0.0 0.0 - 0.2 %    Comment: Performed at [facility], [address] [place], [place] [address]  HIV Antibody (routine testing w rflx)     Status: None   Collection Time: 06/14/21  3:17 AM  Result Value Ref Range   HIV Screen 4th Generation wRfx Non Reactive Non Reactive    Comment: Performed at [facility], [address], [place], [place] [address]  Basic metabolic panel     Status: Abnormal   Collection Time: 06/15/21  3:12 AM  Result Value Ref Range   Sodium 135 135 - 145 mmol/L   Potassium 3.6 3.5 - 5.1 mmol/L   Chloride 104 98 - 111 mmol/L   CO2 22 22 - 32 mmol/L   Glucose, Bld 183 (H) 70 - 99 mg/dL    Comment: Glucose reference range applies only to samples taken after fasting for at least 8 hours.   BUN 9 6 - 20 mg/dL   Creatinine, Ser 2.99 0.44 - 1.00 mg/dL   Calcium 7.9 (L) 8.9 - 10.3 mg/dL   GFR, Estimated >24 >26 mL/min    Comment: (NOTE) Calculated using the CKD-EPI Creatinine Equation (2021)    Anion gap 9 5 - 15    Comment: Performed at [facility], [address] [place], [place] [address]  CBC     Status: Abnormal   Collection Time: 06/15/21  3:12 AM  Result Value Ref Range   WBC 18.3 (H) 4.0 - 10.5 K/uL   RBC 4.18 3.87 - 5.11 MIL/uL   Hemoglobin 13.8 12.0 - 15.0 g/dL   HCT 62.2 29.7 - 98.9 %   MCV 99.0 80.0 - 100.0 fL   MCH 33.0 26.0 - 34.0 pg   MCHC 33.3 30.0 - 36.0 g/dL   RDW 21.1 94.1 - 74.0 %   Platelets 266 150 - 400 K/uL   nRBC 0.0 0.0 -  0.2 %    Comment: Performed at [facility], [address], [place], [place] [address]    Studies/Results: CT ABDOMEN PELVIS W CONTRAST  Result Date: 06/13/2021 CLINICAL DATA:  Bilateral lower quadrant pain and fever. Patient reports right-sided pelvic pain since Friday.  EXAM: CT ABDOMEN AND PELVIS WITH CONTRAST TECHNIQUE: Multidetector CT imaging of the abdomen and pelvis was performed using the standard protocol following bolus administration of intravenous contrast. RADIATION DOSE REDUCTION: This exam was performed according to the departmental dose-optimization program which includes automated exposure control, adjustment of the mA and/or kV according to patient size and/or use of iterative reconstruction technique. CONTRAST:  OMNIPAQUE IOHEXOL 300 MG/ML  SOLN COMPARISON:  None. FINDINGS: Lower chest: No acute airspace disease or pleural effusion. Hepatobiliary: No focal liver abnormality is seen. Possible but not definite gallstone. No pericholecystic fat stranding or inflammation. No biliary dilatation. Pancreas: No ductal dilatation or inflammation. Spleen: Normal in size without focal abnormality. Small cleft posteriorly. Adrenals/Urinary Tract: No adrenal nodule. No hydronephrosis or perinephric edema. Homogeneous renal enhancement with symmetric excretion on delayed phase imaging. No renal calculi or focal renal lesion. Urinary bladder is near completely empty. Stomach/Bowel: There is a linear metallic density in the sigmoid colon. This spans approximately 2.9 cm and is hooked at the end. There is associated sigmoid bowel perforation with mottled extraluminal air, colonic wall thickening, and fat stranding. Small amount of non organized trace fluid. This is in the pelvis just to the left of midline. Free air tracks into the left upper quadrant. No drainable fluid collection. The appendix is visualized and is normal. Small volume of colonic stool. Occasional fluid-filled  loops of small bowel, mildly edematous in the pelvis, likely reactive. No small bowel obstruction. Stomach is decompressed. Vascular/Lymphatic: Aortic atherosclerosis. No aortic aneurysm. There is no portal venous or mesenteric gas. Scattered prominent mesenteric lymph nodes, typically reactive. Few prominent inguinal nodes. Reproductive: T-shaped IUD in the uterus.  No adnexal mass. Other: Mild free air in the pelvis with small amount of non organized free fluid. Patchy soft tissue gas tracks into the left upper abdomen. No drainable collection. Musculoskeletal: There are no acute or suspicious osseous abnormalities. IMPRESSION: 1. Linear metallic density in the sigmoid colon spanning approximately 2.9 cm and hooked at the end, consistent with foreign body. There is associated sigmoid bowel perforation with mottled extraluminal air, colonic wall thickening, none organized trace fluid and fat stranding. Free air tracks into the left upper quadrant of the abdomen. No drainable fluid collection. 2. Possible but not definite gallstone. No evidence of acute cholecystitis. Aortic Atherosclerosis (ICD10-I70.0). Critical Value/emergent results were called by telephone at the time of interpretation on 06/13/2021 at 6:31 pm to provider ADAM CURATOLO , who verbally acknowledged these results. Electronically Signed   By: Narda Rutherford M.D.   On: 06/13/2021 18:32   DG Chest Port 1 View  Result Date: 06/13/2021 CLINICAL DATA:  Right-sided pelvic pain dark urine EXAM: PORTABLE CHEST 1 VIEW COMPARISON:  None. FINDINGS: The heart size and mediastinal contours are within normal limits. Both lungs are clear. The visualized skeletal structures are unremarkable. IMPRESSION: No active disease. Electronically Signed   By: Jasmine Pang M.D.   On: 06/13/2021 17:37    Medications: I have reviewed the patient's current medications.  Assessment: Retained dentures and sigmoid colon causing sigmoid perforation Status post  exploratory laparotomy, sigmoid colectomy and descending colostomy Noted to have feculent peritonitis intraoperatively  Plan: N.p.o., on IV Zosyn and D5 half-normal saline with 20 M EQ KCl at 100 cc an hr GI will sign off, please recall if needed.   Kerin Salen, MD 06/15/2021, 9:49 AM

## 2021-06-15 NOTE — Plan of Care (Signed)
°  Problem: Education: Goal: Knowledge of General Education information will improve Description: Including pain rating scale, medication(s)/side effects and non-pharmacologic comfort measures 06/15/2021 0154 by Virgilio Belling, RN Outcome: Progressing 06/15/2021 0154 by Virgilio Belling, RN Outcome: Progressing   Problem: Activity: Goal: Risk for activity intolerance will decrease 06/15/2021 0154 by Virgilio Belling, RN Outcome: Progressing 06/15/2021 0154 by Virgilio Belling, RN Outcome: Progressing   Problem: Pain Managment: Goal: General experience of comfort will improve Outcome: Progressing   Problem: Safety: Goal: Ability to remain free from injury will improve Outcome: Progressing

## 2021-06-15 NOTE — Plan of Care (Signed)
  Problem: Activity: Goal: Risk for activity intolerance will decrease Outcome: Progressing   Problem: Safety: Goal: Ability to remain free from injury will improve Outcome: Progressing   

## 2021-06-15 NOTE — Progress Notes (Signed)
1 Day Post-Op  Subjective: Pain is well controlled this morning.  Has already ambulated to the RN station and back.  NGT in place.  Discussed surgical procedure and findings with the patient  ROS: See above, otherwise other systems negative  Objective: Vital signs in last 24 hours: Temp:  [97.5 F (36.4 C)-99.1 F (37.3 C)] 97.5 F (36.4 C) (01/25 1019) Pulse Rate:  [81-98] 82 (01/25 1019) Resp:  [16-20] 16 (01/25 1019) BP: (149-197)/(80-111) 149/101 (01/25 1019) SpO2:  [95 %-99 %] 97 % (01/25 1019) Weight:  [72.6 kg] 72.6 kg (01/24 1403) Last BM Date: 06/14/21  Intake/Output from previous day: 01/24 0701 - 01/25 0700 In: 3420 [I.V.:3370; IV Piggyback:50] Out: 600 [Urine:550; Blood:50] Intake/Output this shift: No intake/output data recorded.  PE: Abd: soft, appropriately tender, midline wound is open and packed and clean.  Colostomy with no output yet.  Stoma is viable.  NGT in place with some bilious output.  Lab Results:  Recent Labs    06/14/21 0317 06/15/21 0312  WBC 18.5* 18.3*  HGB 14.8 13.8  HCT 43.7 41.4  PLT 250 266   BMET Recent Labs    06/14/21 0317 06/15/21 0312  NA 134* 135  K 3.1* 3.6  CL 99 104  CO2 22 22  GLUCOSE 99 183*  BUN 17 9  CREATININE 0.61 0.52  CALCIUM 8.5* 7.9*   PT/INR Recent Labs    06/13/21 1700  LABPROT 13.9  INR 1.1   CMP     Component Value Date/Time   NA 135 06/15/2021 0312   K 3.6 06/15/2021 0312   CL 104 06/15/2021 0312   CO2 22 06/15/2021 0312   GLUCOSE 183 (H) 06/15/2021 0312   BUN 9 06/15/2021 0312   CREATININE 0.52 06/15/2021 0312   CALCIUM 7.9 (L) 06/15/2021 0312   PROT 8.4 (H) 06/13/2021 1700   ALBUMIN 3.6 06/13/2021 1700   AST 16 06/13/2021 1700   ALT 13 06/13/2021 1700   ALKPHOS 118 06/13/2021 1700   BILITOT 1.2 06/13/2021 1700   GFRNONAA >60 06/15/2021 0312   Lipase  No results found for: LIPASE     Studies/Results: CT ABDOMEN PELVIS W CONTRAST  Result Date: 06/13/2021 CLINICAL  DATA:  Bilateral lower quadrant pain and fever. Patient reports right-sided pelvic pain since Friday. EXAM: CT ABDOMEN AND PELVIS WITH CONTRAST TECHNIQUE: Multidetector CT imaging of the abdomen and pelvis was performed using the standard protocol following bolus administration of intravenous contrast. RADIATION DOSE REDUCTION: This exam was performed according to the departmental dose-optimization program which includes automated exposure control, adjustment of the mA and/or kV according to patient size and/or use of iterative reconstruction technique. CONTRAST:  100mL OMNIPAQUE IOHEXOL 300 MG/ML  SOLN COMPARISON:  None. FINDINGS: Lower chest: No acute airspace disease or pleural effusion. Hepatobiliary: No focal liver abnormality is seen. Possible but not definite gallstone. No pericholecystic fat stranding or inflammation. No biliary dilatation. Pancreas: No ductal dilatation or inflammation. Spleen: Normal in size without focal abnormality. Small cleft posteriorly. Adrenals/Urinary Tract: No adrenal nodule. No hydronephrosis or perinephric edema. Homogeneous renal enhancement with symmetric excretion on delayed phase imaging. No renal calculi or focal renal lesion. Urinary bladder is near completely empty. Stomach/Bowel: There is a linear metallic density in the sigmoid colon. This spans approximately 2.9 cm and is hooked at the end. There is associated sigmoid bowel perforation with mottled extraluminal air, colonic wall thickening, and fat stranding. Small amount of non organized trace fluid. This is in the pelvis  just to the left of midline. Free air tracks into the left upper quadrant. No drainable fluid collection. The appendix is visualized and is normal. Small volume of colonic stool. Occasional fluid-filled loops of small bowel, mildly edematous in the pelvis, likely reactive. No small bowel obstruction. Stomach is decompressed. Vascular/Lymphatic: Aortic atherosclerosis. No aortic aneurysm. There is no  portal venous or mesenteric gas. Scattered prominent mesenteric lymph nodes, typically reactive. Few prominent inguinal nodes. Reproductive: T-shaped IUD in the uterus.  No adnexal mass. Other: Mild free air in the pelvis with small amount of non organized free fluid. Patchy soft tissue gas tracks into the left upper abdomen. No drainable collection. Musculoskeletal: There are no acute or suspicious osseous abnormalities. IMPRESSION: 1. Linear metallic density in the sigmoid colon spanning approximately 2.9 cm and hooked at the end, consistent with foreign body. There is associated sigmoid bowel perforation with mottled extraluminal air, colonic wall thickening, none organized trace fluid and fat stranding. Free air tracks into the left upper quadrant of the abdomen. No drainable fluid collection. 2. Possible but not definite gallstone. No evidence of acute cholecystitis. Aortic Atherosclerosis (ICD10-I70.0). Critical Value/emergent results were called by telephone at the time of interpretation on 06/13/2021 at 6:31 pm to provider ADAM CURATOLO , who verbally acknowledged these results. Electronically Signed   By: Narda Rutherford M.D.   On: 06/13/2021 18:32   DG Chest Port 1 View  Result Date: 06/13/2021 CLINICAL DATA:  Right-sided pelvic pain dark urine EXAM: PORTABLE CHEST 1 VIEW COMPARISON:  None. FINDINGS: The heart size and mediastinal contours are within normal limits. Both lungs are clear. The visualized skeletal structures are unremarkable. IMPRESSION: No active disease. Electronically Signed   By: Jasmine Pang M.D.   On: 06/13/2021 17:37    Anti-infectives: Anti-infectives (From admission, onward)    Start     Dose/Rate Route Frequency Ordered Stop   06/14/21 2000  piperacillin-tazobactam (ZOSYN) IVPB 3.375 g        3.375 g 12.5 mL/hr over 240 Minutes Intravenous Every 8 hours 06/14/21 1922     06/14/21 0600  piperacillin-tazobactam (ZOSYN) IVPB 3.375 g  Status:  Discontinued        3.375  g 12.5 mL/hr over 240 Minutes Intravenous Every 8 hours 06/13/21 2149 06/14/21 1906   06/13/21 2200  piperacillin-tazobactam (ZOSYN) IVPB 3.375 g  Status:  Discontinued        3.375 g 12.5 mL/hr over 240 Minutes Intravenous Every 8 hours 06/13/21 2147 06/13/21 2148   06/13/21 1715  cefTRIAXone (ROCEPHIN) 2 g in sodium chloride 0.9 % 100 mL IVPB        2 g 200 mL/hr over 30 Minutes Intravenous  Once 06/13/21 1700 06/13/21 1834   06/13/21 1715  metroNIDAZOLE (FLAGYL) IVPB 500 mg        500 mg 100 mL/hr over 60 Minutes Intravenous  Once 06/13/21 1700 06/13/21 1902        Assessment/Plan POD 1, s/p Hartmann's for perforated colon secondary to dental appliance by Dr. Gerrit Friends 1/24 -continue NGT in anticipation of possible post op ileus given feculent peritonitis and contamination. -cont IV abx therapy, likely 5 days -WOC consult for new colostomy -leave abdominal packing today and change tomorrow with plans to likely place a wound VAC on Friday -mobilize, pulm toilet   FEN - NPO/NGT/IVFs VTE - lovenox ID - zosyn  Elevated BP - prn anti-hypertensives   LOS: 2 days    Letha Cape , [facility] Surgery 06/15/2021, 10:51  AM Please see Amion for pager number during day hours 7:00am-4:30pm or 7:00am -11:30am on weekends

## 2021-06-15 NOTE — Anesthesia Postprocedure Evaluation (Signed)
Anesthesia Post Note  Patient: Misty Peck  Procedure(s) Performed: EXPLORATORY LAPAROTOMY,  SIGMOID COLECTOMY WITH COLOSTOMY FLEXIBLE SIGMOIDOSCOPY     Patient location during evaluation: PACU Anesthesia Type: General Level of consciousness: awake and alert Pain management: pain level controlled Vital Signs Assessment: post-procedure vital signs reviewed and stable Respiratory status: spontaneous breathing, nonlabored ventilation, respiratory function stable and patient connected to nasal cannula oxygen Cardiovascular status: blood pressure returned to baseline and stable Postop Assessment: no apparent nausea or vomiting Anesthetic complications: no   No notable events documented.  Last Vitals:  Vitals:   06/15/21 0051 06/15/21 0500  BP: (!) 163/102 (!) 151/96  Pulse: 89 90  Resp: 17 17  Temp: 36.7 C 36.9 C  SpO2: 97% 99%    Last Pain:  Vitals:   06/15/21 0552  TempSrc:   PainSc: 5                  Shareeka Yim

## 2021-06-16 LAB — BASIC METABOLIC PANEL
Anion gap: 8 (ref 5–15)
BUN: 11 mg/dL (ref 6–20)
CO2: 28 mmol/L (ref 22–32)
Calcium: 8.5 mg/dL — ABNORMAL LOW (ref 8.9–10.3)
Chloride: 99 mmol/L (ref 98–111)
Creatinine, Ser: 0.55 mg/dL (ref 0.44–1.00)
GFR, Estimated: >60 mL/min (ref >60)
Glucose, Bld: 124 mg/dL — ABNORMAL HIGH (ref 70–99)
Potassium: 3.6 mmol/L (ref 3.5–5.1)
Sodium: 135 mmol/L (ref 135–145)

## 2021-06-16 LAB — CBC
HCT: 41.3 % (ref 36.0–46.0)
Hemoglobin: 13.6 g/dL (ref 12.0–15.0)
MCH: 33.1 pg (ref 26.0–34.0)
MCHC: 32.9 g/dL (ref 30.0–36.0)
MCV: 100.5 fL — ABNORMAL HIGH (ref 80.0–100.0)
Platelets: 297 10*3/uL (ref 150–400)
RBC: 4.11 MIL/uL (ref 3.87–5.11)
RDW: 14.4 % (ref 11.5–15.5)
WBC: 17.4 10*3/uL — ABNORMAL HIGH (ref 4.0–10.5)
nRBC: 0 % (ref 0.0–0.2)

## 2021-06-16 LAB — SURGICAL PATHOLOGY

## 2021-06-16 NOTE — TOC Initial Note (Signed)
Transition of Care [facility]) - Initial/Assessment Note    Patient Details  Name: Misty Peck MRN: [id number] Date of Birth: 08/05/1975  Transition of Care [facility]) CM/SW Contact:    Lennart Pall, LCSW Phone Number: 06/16/2021, 2:45 PM  Clinical Narrative:                 Met with pt today to introduce self/ TOC role with dc planning needs.  Pt aware that MD planning to place wound VAC tomorrow and this will be used in the home setting.  She notes that she has good support at home from her spouse and niece but is hopeful I can secure [facility] services as well.  Of note, pt confirms that she has PCP at [place] and has continued to be followed there despite having lost insurance coverage last year.   Have placed a referral to [place] for charity [facility] coverage and await answer on acceptance.  Have placed a referral with [facility] Leontine Locket) to begin order for the home wound VAC under charity coverage as well. Will continue to follow and secure referrals.  Expected Discharge Plan: [place] Barriers to Discharge: Continued Medical Work up, Inadequate or no insurance   Patient Goals and CMS Choice Patient states their goals for this hospitalization and ongoing recovery are:: return home      Expected Discharge Plan and Services Expected Discharge Plan: [place] In-house Referral: Clinical Social Work     Living arrangements for the past 2 months: Single Family Home                 DME Arranged: Ostomy supplies, Negative pressure wound device DME Agency: [facility] Date DME Agency Contacted: 06/16/21 Time DME Agency Contacted: 42 Representative spoke with at DME Agency: Leontine Locket            Prior Living Arrangements/Services Living arrangements for the past 2 months: [place] Lives with:: Spouse, Relatives Patient language and need for interpreter reviewed:: Yes Do you feel safe going back to the place where you live?:  Yes      Need for Family Participation in Patient Care: Yes (Comment) Care giver support system in place?: Yes (comment)   Criminal Activity/Legal Involvement Pertinent to Current Situation/Hospitalization: No - Comment as needed  Activities of Daily Living Home Assistive Devices/Equipment: None ADL Screening (condition at time of admission) Patient's cognitive ability adequate to safely complete daily activities?: Yes Is the patient deaf or have difficulty hearing?: Yes (partial hearing loss) Does the patient have difficulty seeing, even when wearing glasses/contacts?: No Does the patient have difficulty concentrating, remembering, or making decisions?: No Patient able to express need for assistance with ADLs?: Yes Does the patient have difficulty dressing or bathing?: No Independently performs ADLs?: Yes (appropriate for developmental age) Does the patient have difficulty walking or climbing stairs?: No Weakness of Legs: None Weakness of Arms/Hands: None  Permission Sought/Granted Permission sought to share information with : Other (comment) Permission granted to share information with : Yes, Verbal Permission Granted     Permission granted to share info w AGENCY: HH and [facility] for wound VAC        Emotional Assessment Appearance:: Appears stated age Attitude/Demeanor/Rapport: Gracious Affect (typically observed): Accepting Orientation: : Oriented to Self, Oriented to Place, Oriented to  Time, Oriented to Situation Alcohol / Substance Use: Not Applicable Psych Involvement: No (comment)  Admission diagnosis:  Foreign body in colon, initial encounter [  T18.4XXA] Perforated sigmoid colon ([place]) [K63.1] Sepsis, due to unspecified organism, unspecified whether acute organ dysfunction present [facility]) [A41.9] Patient Active Problem List   Diagnosis Date Noted   Perforated sigmoid colon ([place]) 06/13/2021   PCP:  [facility], [place]:   [place],  [place] [address] [place] [address] Phone: [phone] Fax: [phone]     Social Determinants of Health (SDOH) Interventions    Readmission Risk Interventions Readmission Risk Prevention Plan 06/16/2021  Post Dischage Appt Complete  Medication Screening Complete  Transportation Screening Complete  Some recent data might be hidden

## 2021-06-16 NOTE — Progress Notes (Signed)
NGT clamped approx 1030am. Patient tolerated well throughout the day with no N/V and minimal pain. Ambulated in hallway intermittently throughout the day.

## 2021-06-16 NOTE — Plan of Care (Signed)
  Problem: Education: Goal: Knowledge of General Education information will improve Description: Including pain rating scale, medication(s)/side effects and non-pharmacologic comfort measures Outcome: Progressing   Problem: Health Behavior/Discharge Planning: Goal: Ability to manage health-related needs will improve Outcome: Progressing   Problem: Activity: Goal: Risk for activity intolerance will decrease Outcome: Progressing   

## 2021-06-16 NOTE — Consult Note (Signed)
WOC Nurse ostomy consult note Stoma type/location: LLQ, end colostomy Stomal assessment/size: 1 1/4" round, slightly budded, pink, moist  Peristomal assessment: NA Treatment options for stomal/peristomal skin:  Output scant bloody drainage  Ostomy pouching: 1pc. Convex flex in place from OR Education provided: discussed SS program and basic care for ostomy; we will meet at 0830 in the am to for teaching with her husband present per her request. I will place NPWT dressing at that time as well.  Enrolled patient in [employer] DC program: Yes CCS aware of plans for timing tomorrow am. Will need premedication Supplies ordered for NPWT dressing and ostomy pouch change.   WOC Nurse will follow along with you for continued support with ostomy teaching and care; and assistance with NPWT dressing.  Smera Guyette [facility] MSN, RN, [place], CNS, [place] [phone]

## 2021-06-16 NOTE — Plan of Care (Signed)
°  Problem: Education: Goal: Knowledge of General Education information will improve Description: Including pain rating scale, medication(s)/side effects and non-pharmacologic comfort measures 06/16/2021 0056 by Virgilio Belling, RN Outcome: Progressing 06/16/2021 0055 by Virgilio Belling, RN Outcome: Progressing   Problem: Activity: Goal: Risk for activity intolerance will decrease 06/16/2021 0056 by Virgilio Belling, RN Outcome: Progressing 06/16/2021 0055 by Virgilio Belling, RN Outcome: Progressing   Problem: Elimination: Goal: Will not experience complications related to urinary retention Outcome: Progressing   Problem: Pain Managment: Goal: General experience of comfort will improve Outcome: Progressing

## 2021-06-16 NOTE — Progress Notes (Signed)
2 Days Post-Op  Subjective: Feels well today.  Says she has heard some flatus in her bag.  Up ambulating multiple times a day.  No other complaints  ROS: See above, otherwise other systems negative  Objective: Vital signs in last 24 hours: Temp:  [97.4 F (36.3 C)-97.5 F (36.4 C)] 97.4 F (36.3 C) (01/26 0508) Pulse Rate:  [78-83] 82 (01/26 0508) Resp:  [16-17] 17 (01/26 0508) BP: (142-174)/(98-112) 162/112 (01/26 0508) SpO2:  [94 %-98 %] 96 % (01/26 0508) Last BM Date: 06/14/21  Intake/Output from previous day: 01/25 0701 - 01/26 0700 In: -  Out: 600 [Emesis/NG output:600] Intake/Output this shift: Total I/O In: 400 [IV Piggyback:400] Out: -   PE: Abd: soft, appropriately tender, midline wound is open and packed and clean.  Colostomy with no output yet.  Stoma is viable.  NGT in place with some bilious output, but not much, +BS  Lab Results:  Recent Labs    06/15/21 0312 06/16/21 0300  WBC 18.3* 17.4*  HGB 13.8 13.6  HCT 41.4 41.3  PLT 266 297   BMET Recent Labs    06/15/21 0312 06/16/21 0300  NA 135 135  K 3.6 3.6  CL 104 99  CO2 22 28  GLUCOSE 183* 124*  BUN 9 11  CREATININE 0.52 0.55  CALCIUM 7.9* 8.5*   PT/INR Recent Labs    06/13/21 1700  LABPROT 13.9  INR 1.1   CMP     Component Value Date/Time   NA 135 06/16/2021 0300   K 3.6 06/16/2021 0300   CL 99 06/16/2021 0300   CO2 28 06/16/2021 0300   GLUCOSE 124 (H) 06/16/2021 0300   BUN 11 06/16/2021 0300   CREATININE 0.55 06/16/2021 0300   CALCIUM 8.5 (L) 06/16/2021 0300   PROT 8.4 (H) 06/13/2021 1700   ALBUMIN 3.6 06/13/2021 1700   AST 16 06/13/2021 1700   ALT 13 06/13/2021 1700   ALKPHOS 118 06/13/2021 1700   BILITOT 1.2 06/13/2021 1700   GFRNONAA >60 06/16/2021 0300   Lipase  No results found for: LIPASE     Studies/Results: No results found.  Anti-infectives: Anti-infectives (From admission, onward)    Start     Dose/Rate Route Frequency Ordered Stop   06/14/21  2000  piperacillin-tazobactam (ZOSYN) IVPB 3.375 g        3.375 g 12.5 mL/hr over 240 Minutes Intravenous Every 8 hours 06/14/21 1922     06/14/21 0600  piperacillin-tazobactam (ZOSYN) IVPB 3.375 g  Status:  Discontinued        3.375 g 12.5 mL/hr over 240 Minutes Intravenous Every 8 hours 06/13/21 2149 06/14/21 1906   06/13/21 2200  piperacillin-tazobactam (ZOSYN) IVPB 3.375 g  Status:  Discontinued        3.375 g 12.5 mL/hr over 240 Minutes Intravenous Every 8 hours 06/13/21 2147 06/13/21 2148   06/13/21 1715  cefTRIAXone (ROCEPHIN) 2 g in sodium chloride 0.9 % 100 mL IVPB        2 g 200 mL/hr over 30 Minutes Intravenous  Once 06/13/21 1700 06/13/21 1834   06/13/21 1715  metroNIDAZOLE (FLAGYL) IVPB 500 mg        500 mg 100 mL/hr over 60 Minutes Intravenous  Once 06/13/21 1700 06/13/21 1902        Assessment/Plan POD 2, s/p Hartmann's for perforated colon secondary to dental appliance by Dr. Gerrit FriendsGerkin 1/24 -don't see air in her bag, but she has heard some flatus.  She has good BS  and low NGT output.  Will clamp today and see how she does with this. -cont IV abx therapy, likely 5 days -WOC consult for new colostomy and for wound VAC placement tomorrow. -mobilize, pulm toilet   FEN - NPO/NGT, clamped/IVFs VTE - lovenox ID - zosyn  Elevated BP - prn anti-hypertensives   LOS: 3 days    Henreitta Cea , [facility] Surgery 06/16/2021, 10:45 AM Please see Amion for pager number during day hours 7:00am-4:30pm or 7:00am -11:30am on weekends

## 2021-06-17 LAB — CBC
HCT: 40.8 % (ref 36.0–46.0)
Hemoglobin: 13.5 g/dL (ref 12.0–15.0)
MCH: 32.9 pg (ref 26.0–34.0)
MCHC: 33.1 g/dL (ref 30.0–36.0)
MCV: 99.5 fL (ref 80.0–100.0)
Platelets: 373 10*3/uL (ref 150–400)
RBC: 4.1 MIL/uL (ref 3.87–5.11)
RDW: 14.3 % (ref 11.5–15.5)
WBC: 14.8 10*3/uL — ABNORMAL HIGH (ref 4.0–10.5)
nRBC: 0 % (ref 0.0–0.2)

## 2021-06-17 NOTE — Plan of Care (Signed)

## 2021-06-17 NOTE — Discharge Instructions (Addendum)
Wet to Dry WOUND CARE: - Change dressing twice daily - Supplies: sterile saline, kerlex, scissors, ABD pads, tape  Remove dressing and all packing carefully, moistening with sterile saline as needed to avoid packing/internal dressing sticking to the wound. 2.   Clean edges of skin around the wound with water/gauze, making sure there is no tape debris or leakage left on skin that could cause skin irritation or breakdown. 3.   Dampen and clean kerlex with sterile saline and pack wound from wound base to skin level, making sure to take note of any possible areas of wound tracking, tunneling and packing appropriately. Wound can be packed loosely. Trim kerlex to size if a whole kerlex is not required. 4.   Cover wound with a dry ABD pad and secure with tape.  5.   Write the date/time on the dry dressing/tape to better track when the last dressing change occurred. - apply any skin protectant/powder if recommended by clinician to protect skin/skin folds. - change dressing as needed if leakage occurs, wound gets contaminated, or patient requests to shower. - You may shower daily with wound open and following the shower the wound should be dried and a clean dressing placed.  - Medical grade tape as well as packing supplies can be found at [facility] on [address] or [facility] on [address]. The remaining supplies can be found at your local drug store, [employer] etc.  [facility]      [facility] [phone]  OPEN ABDOMINAL SURGERY: POST OP INSTRUCTIONS  Always review your discharge instruction sheet given to you by the facility where your surgery was performed.  IF YOU HAVE DISABILITY OR FAMILY LEAVE FORMS, YOU MUST BRING THEM TO THE OFFICE FOR PROCESSING.  PLEASE DO NOT GIVE THEM TO YOUR DOCTOR.  A prescription for pain medication may be given to you upon discharge.  Take your pain medication as prescribed, if needed.  If narcotic pain medicine is not needed,  then you may take acetaminophen (Tylenol) or ibuprofen (Advil) as needed. Take your usually prescribed medications unless otherwise directed. If you need a refill on your pain medication, please contact your pharmacy. They will contact our office to request authorization.  Prescriptions will not be filled after 5pm or on week-ends. You should follow a light diet the first few days after arrival home, such as soup and crackers, pudding, etc.unless your doctor has advised otherwise. A high-fiber, low fat diet can be resumed as tolerated.   Be sure to include lots of fluids daily. Most patients will experience some swelling and bruising on the chest and neck area.  Ice packs will help.  Swelling and bruising can take several days to resolve Most patients will experience some swelling and bruising in the area of the incision. Ice pack will help. Swelling and bruising can take several days to resolve..  It is common to experience some constipation if taking pain medication after surgery.  Increasing fluid intake and taking a stool softener will usually help or prevent this problem from occurring.  A mild laxative (Milk of Magnesia or Miralax) should be taken according to package directions if there are no bowel movements after 48 hours.  You may have steri-strips (small skin tapes) in place directly over the incision.  These strips should be left on the skin for 7-10 days.  If your surgeon used skin glue on the incision, you may shower in 24 hours.  The glue will flake off over the   next 2-3 weeks.  Any sutures or staples will be removed at the office during your follow-up visit. You may find that a light gauze bandage over your incision may keep your staples from being rubbed or pulled. You may shower and replace the bandage daily. ACTIVITIES:  You may resume regular (light) daily activities beginning the next day--such as daily self-care, walking, climbing stairs--gradually increasing activities as tolerated.   You may have sexual intercourse when it is comfortable.  Refrain from any heavy lifting or straining until approved by your doctor. You may drive when you no longer are taking prescription pain medication, you can comfortably wear a seatbelt, and you can safely maneuver your car and apply brakes Return to Work: ___________________________________ You should see your doctor in the office for a follow-up appointment approximately two weeks after your surgery.  Make sure that you call for this appointment within a day or two after you arrive home to insure a convenient appointment time. OTHER INSTRUCTIONS:  _____________________________________________________________ _____________________________________________________________  WHEN TO CALL YOUR DOCTOR: Fever over 101.0 Inability to urinate Nausea and/or vomiting Extreme swelling or bruising Continued bleeding from incision. Increased pain, redness, or drainage from the incision. Difficulty swallowing or breathing Muscle cramping or spasms. Numbness or tingling in hands or feet or around lips.  The clinic staff is available to answer your questions during regular business hours.  Please don't hesitate to call and ask to speak to one of the nurses if you have concerns.  For further questions, please visit [email or web address]   

## 2021-06-17 NOTE — Consult Note (Signed)
WOC Nurse Consult Note: Reason for Consult: placement of NPWT dressing Wound type: surgical  Pressure Injury POA: NA Measurement: 16cm x 5cm x 6cm  Wound bed:100% pink, clean, subcutaneous tissue Drainage (amount, consistency, odor) serosanguinous Periwound: intact  Dressing procedure/placement/frequency: 2pc of black foam placed, 1 into deeper portions of the wound bed. 1 to fill the remainder of the wound bed. Ostomy barrier ring 1/4 used; shaped into strips placed into the creases at 3 and o'clock, near umbilicus to aid in seal Seal obtained at Patient received IV pain meds prior to dressing change NG removed just prior to dressing change Patient and husband understand HHRN would change NPWT dressings.   Extra canister and dressing in the room for use.   WOC Nurse ostomy follow up Stoma type/location: LLQ, end colostomy  Stomal assessment/size: 1" x 1 3/4" oval shaped, dark but pink, moist, flush with skin Peristomal assessment: intact  Treatment options for stomal/peristomal skin: intact but some rippling of the abdominal topography  Output scant, bloody  Ostomy pouching: 2pc. 2 1/4" with 2" skin barrier ring Education provided:  Explained role of ostomy nurse and creation of stoma  Explained stoma characteristics (budded, flush, color, texture, care) Demonstrated pouch change (cutting new skin barrier, measuring stoma, cleaning peristomal skin and stoma, use of barrier ring) Education on emptying when 1/3 to 1/2 full and how to empty Demonstrated "burping" flatus from pouch Demonstrated use of wick to clean spout  Patient's husband in the room for wound and ostomy care; got flushed with ostomy pouch change and had to sit down.  Patient will be able to perform some of the care when she is able to stand in front of a mirror, currently she can not see it well due to location  Enrolled patient in [place] Secure Start Discharge program: Yes  WOC Nurse will follow along  with you for continued support with ostomy teaching and care and NPWT dressings due to proximity to wound and ostomy  [facility] MSN, RN, [place], CNS, CWON-AP [phone]

## 2021-06-17 NOTE — Progress Notes (Signed)
Pharmacy Antibiotic Note  Misty Peck is a 46 y.o. female admitted on 06/13/2021 with abdominal pain and abnormal CT showing foreign body in sigmoid colon.  Patient underwent  exploratory laparotomy, sigmoid colectomy, and descending colostomy for perforated sigmoid colon.  Per op note, feculent peritonitis was present. Pharmacy has been consulted for zosyn dosing.  Today, 06/17/21 WBC remains elevated but trending down SCr WNL and stable Afebrile  Day #4 of IV zosyn.  Plan: Continue piperacillin/tazobactam 3.375 g IV q8h EI  Renal function has been stable, pharmacy to sign off. Please re-consult if needed.  Height: 5' (152.4 cm) Weight: 72.6 kg (160 lb) IBW/kg (Calculated) : 45.5  Temp (24hrs), Avg:98.1 F (36.7 C), Min:97.7 F (36.5 C), Max:98.8 F (37.1 C)  Recent Labs  Lab 06/13/21 1700 06/14/21 0317 06/15/21 0312 06/16/21 0300 06/17/21 0301  WBC 23.6* 18.5* 18.3* 17.4* 14.8*  CREATININE 0.79 0.61 0.52 0.55  --   LATICACIDVEN 1.5  --   --   --   --      Estimated Creatinine Clearance: 78.1 mL/min (by C-G formula based on SCr of 0.55 mg/dL).    No Known Allergies  Antimicrobials this admission: Zosyn 1/24 >>  CTX/Flagyl x 1  Dose adjustments this admission:   Microbiology results: 1/23 BCx: ngtd 1/23 UCx:  multiple species, suggest recollection  [name], PharmD 06/17/21 12:17 PM

## 2021-06-17 NOTE — Progress Notes (Signed)
3 Days Post-Op  Subjective: No nausea with NGT clamped since yesterday.  Says she still hears some flatus (bag is empty).  No residual when checked.  Ambulating great.  ROS: See above, otherwise other systems negative  Objective: Vital signs in last 24 hours: Temp:  [97.7 F (36.5 C)-98.8 F (37.1 C)] 98.8 F (37.1 C) (01/27 0636) Pulse Rate:  [88-93] 90 (01/27 0636) Resp:  [15-18] 15 (01/27 0636) BP: (168-177)/(106-118) 176/106 (01/27 0636) SpO2:  [95 %-96 %] 95 % (01/27 0636) Last BM Date: 06/14/21  Intake/Output from previous day: 01/26 0701 - 01/27 0700 In: 1290 [I.V.:790; IV Piggyback:500] Out: -  Intake/Output this shift: No intake/output data recorded.  PE: Abd: soft, appropriately tender, midline wound is open and clean..  Colostomy with no output yet.  Stoma is viable.  +BS    Lab Results:  Recent Labs    06/16/21 0300 06/17/21 0301  WBC 17.4* 14.8*  HGB 13.6 13.5  HCT 41.3 40.8  PLT 297 373   BMET Recent Labs    06/15/21 0312 06/16/21 0300  NA 135 135  K 3.6 3.6  CL 104 99  CO2 22 28  GLUCOSE 183* 124*  BUN 9 11  CREATININE 0.52 0.55  CALCIUM 7.9* 8.5*   PT/INR No results for input(s): LABPROT, INR in the last 72 hours.  CMP     Component Value Date/Time   NA 135 06/16/2021 0300   K 3.6 06/16/2021 0300   CL 99 06/16/2021 0300   CO2 28 06/16/2021 0300   GLUCOSE 124 (H) 06/16/2021 0300   BUN 11 06/16/2021 0300   CREATININE 0.55 06/16/2021 0300   CALCIUM 8.5 (L) 06/16/2021 0300   PROT 8.4 (H) 06/13/2021 1700   ALBUMIN 3.6 06/13/2021 1700   AST 16 06/13/2021 1700   ALT 13 06/13/2021 1700   ALKPHOS 118 06/13/2021 1700   BILITOT 1.2 06/13/2021 1700   GFRNONAA >60 06/16/2021 0300   Lipase  No results found for: LIPASE     Studies/Results: No results found.  Anti-infectives: Anti-infectives (From admission, onward)    Start     Dose/Rate Route Frequency Ordered Stop   06/14/21 2000  piperacillin-tazobactam (ZOSYN) IVPB  3.375 g        3.375 g 12.5 mL/hr over 240 Minutes Intravenous Every 8 hours 06/14/21 1922     06/14/21 0600  piperacillin-tazobactam (ZOSYN) IVPB 3.375 g  Status:  Discontinued        3.375 g 12.5 mL/hr over 240 Minutes Intravenous Every 8 hours 06/13/21 2149 06/14/21 1906   06/13/21 2200  piperacillin-tazobactam (ZOSYN) IVPB 3.375 g  Status:  Discontinued        3.375 g 12.5 mL/hr over 240 Minutes Intravenous Every 8 hours 06/13/21 2147 06/13/21 2148   06/13/21 1715  cefTRIAXone (ROCEPHIN) 2 g in sodium chloride 0.9 % 100 mL IVPB        2 g 200 mL/hr over 30 Minutes Intravenous  Once 06/13/21 1700 06/13/21 1834   06/13/21 1715  metroNIDAZOLE (FLAGYL) IVPB 500 mg        500 mg 100 mL/hr over 60 Minutes Intravenous  Once 06/13/21 1700 06/13/21 1902        Assessment/Plan POD 3, s/p Hartmann's for perforated colon secondary to dental appliance by Dr. Gerrit Friends 1/24 -no ostomy output yet, but good BS, and no NGT residual or nausea. -Dc NGT and allow CLD today -cont IV abx therapy, likely 5 days -WOC placing VAC today and doing colostomy  teaching with the patient and her husband this morning -mobilize, pulm toilet   FEN - CLD/IVFs VTE - lovenox ID - zosyn  Elevated BP - prn anti-hypertensives ordered several days ago but still not given, discussed with RN today about giving these to see if this will help her BP.  Now that she can have orals, may need to consider initiation of BP med given persistently elevated BP  Straightforward MDM    LOS: 4 days    Letha Cape , [facility] Surgery 06/17/2021, 9:27 AM Please see Amion for pager number during day hours 7:00am-4:30pm or 7:00am -11:30am on weekends

## 2021-06-18 LAB — CULTURE, BLOOD (ROUTINE X 2)
Culture: NO GROWTH
Culture: NO GROWTH
Special Requests: ADEQUATE
Special Requests: ADEQUATE

## 2021-06-18 MED ORDER — SODIUM CHLORIDE 0.9 % IV SOLN: INTRAVENOUS | Status: DC

## 2021-06-18 NOTE — Progress Notes (Signed)
4 Days Post-Op   Subjective/Chief Complaint: Having less pain Tolerating clears   Objective: Vital signs in last 24 hours: Temp:  [98.6 F (37 C)-98.8 F (37.1 C)] 98.7 F (37.1 C) (01/28 0526) Pulse Rate:  [81-108] 92 (01/28 0526) Resp:  [15-20] 20 (01/28 0526) BP: (142-171)/(90-112) 162/103 (01/28 0526) SpO2:  [96 %-97 %] 96 % (01/28 0526) Last BM Date: 06/14/21  Intake/Output from previous day: 01/27 0701 - 01/28 0700 In: 1994.9 [P.O.:460; I.V.:1384.9; IV Piggyback:150] Out: 10 [Stool:10] Intake/Output this shift: Total I/O In: 220 [P.O.:220] Out: -   Exam: Awake and alert Ostomy in place, pink Wound VAC working well  Lab Results:  Recent Labs    06/16/21 0300 06/17/21 0301  WBC 17.4* 14.8*  HGB 13.6 13.5  HCT 41.3 40.8  PLT 297 373   BMET Recent Labs    06/16/21 0300  NA 135  K 3.6  CL 99  CO2 28  GLUCOSE 124*  BUN 11  CREATININE 0.55  CALCIUM 8.5*   PT/INR No results for input(s): LABPROT, INR in the last 72 hours. ABG No results for input(s): PHART, HCO3 in the last 72 hours.  Invalid input(s): PCO2, PO2  Studies/Results: No results found.  Anti-infectives: Anti-infectives (From admission, onward)    Start     Dose/Rate Route Frequency Ordered Stop   06/14/21 2000  piperacillin-tazobactam (ZOSYN) IVPB 3.375 g        3.375 g 12.5 mL/hr over 240 Minutes Intravenous Every 8 hours 06/14/21 1922     06/14/21 0600  piperacillin-tazobactam (ZOSYN) IVPB 3.375 g  Status:  Discontinued        3.375 g 12.5 mL/hr over 240 Minutes Intravenous Every 8 hours 06/13/21 2149 06/14/21 1906   06/13/21 2200  piperacillin-tazobactam (ZOSYN) IVPB 3.375 g  Status:  Discontinued        3.375 g 12.5 mL/hr over 240 Minutes Intravenous Every 8 hours 06/13/21 2147 06/13/21 2148   06/13/21 1715  cefTRIAXone (ROCEPHIN) 2 g in sodium chloride 0.9 % 100 mL IVPB        2 g 200 mL/hr over 30 Minutes Intravenous  Once 06/13/21 1700 06/13/21 1834   06/13/21 1715   metroNIDAZOLE (FLAGYL) IVPB 500 mg        500 mg 100 mL/hr over 60 Minutes Intravenous  Once 06/13/21 1700 06/13/21 1902       Assessment/Plan:  POD #4 s/p Hartmann's for perforated colon secondary to dental appliance by Dr. Gerrit Friends 1/24  Advance to full liquid diet Continue wound VAC Repeat CBC in the morning Continue antibiotics    Abigail Miyamoto 06/18/2021

## 2021-06-18 NOTE — Plan of Care (Signed)
  Problem: Education: Goal: Knowledge of General Education information will improve Description: Including pain rating scale, medication(s)/side effects and non-pharmacologic comfort measures Outcome: Progressing   Problem: Activity: Goal: Risk for activity intolerance will decrease Outcome: Progressing   Problem: Pain Managment: Goal: General experience of comfort will improve Outcome: Progressing   Problem: Elimination: Goal: Will not experience complications related to urinary retention Outcome: Progressing   

## 2021-06-18 NOTE — Plan of Care (Signed)
  Problem: Coping: Goal: Level of anxiety will decrease Outcome: Progressing   Problem: Pain Managment: Goal: General experience of comfort will improve Outcome: Progressing   

## 2021-06-19 NOTE — Progress Notes (Signed)
5 Days Post-Op   Subjective/Chief Complaint: Doing well Tolerated fulls Ostomy working   Objective: Vital signs in last 24 hours: Temp:  [98.3 F (36.8 C)-98.8 F (37.1 C)] 98.3 F (36.8 C) (01/29 0554) Pulse Rate:  [100-101] 100 (01/29 0554) Resp:  [18-20] 20 (01/29 0554) BP: (137-159)/(80-103) 157/103 (01/29 0554) SpO2:  [95 %-96 %] 95 % (01/29 0554) Last BM Date: 06/19/21  Intake/Output from previous day: 01/28 0701 - 01/29 0700 In: 4041.2 [P.O.:1340; I.V.:2551.3; IV Piggyback:149.9] Out: 50 [Drains:50] Intake/Output this shift: No intake/output data recorded.  Exam: Looks good today Abdomen soft, VAC in place, ostomy productive  Lab Results:  Recent Labs    06/17/21 0301  WBC 14.8*  HGB 13.5  HCT 40.8  PLT 373   BMET No results for input(s): NA, K, CL, CO2, GLUCOSE, BUN, CREATININE, CALCIUM in the last 72 hours. PT/INR No results for input(s): LABPROT, INR in the last 72 hours. ABG No results for input(s): PHART, HCO3 in the last 72 hours.  Invalid input(s): PCO2, PO2  Studies/Results: No results found.  Anti-infectives: Anti-infectives (From admission, onward)    Start     Dose/Rate Route Frequency Ordered Stop   06/14/21 2000  piperacillin-tazobactam (ZOSYN) IVPB 3.375 g        3.375 g 12.5 mL/hr over 240 Minutes Intravenous Every 8 hours 06/14/21 1922     06/14/21 0600  piperacillin-tazobactam (ZOSYN) IVPB 3.375 g  Status:  Discontinued        3.375 g 12.5 mL/hr over 240 Minutes Intravenous Every 8 hours 06/13/21 2149 06/14/21 1906   06/13/21 2200  piperacillin-tazobactam (ZOSYN) IVPB 3.375 g  Status:  Discontinued        3.375 g 12.5 mL/hr over 240 Minutes Intravenous Every 8 hours 06/13/21 2147 06/13/21 2148   06/13/21 1715  cefTRIAXone (ROCEPHIN) 2 g in sodium chloride 0.9 % 100 mL IVPB        2 g 200 mL/hr over 30 Minutes Intravenous  Once 06/13/21 1700 06/13/21 1834   06/13/21 1715  metroNIDAZOLE (FLAGYL) IVPB 500 mg        500 mg 100  mL/hr over 60 Minutes Intravenous  Once 06/13/21 1700 06/13/21 1902       Assessment/Plan: Postop day #5 status post Hartman's for perforated colon secondary to dental appliance by TG 1/24  Continue wound VAC Regular diet Continue antibiotics  Hopefully she will be ready for discharge in the next 24 to 48 hours   LOS: 6 days    Misty Peck 06/19/2021

## 2021-06-19 NOTE — Plan of Care (Signed)
Problem: Education: Goal: Knowledge of General Education information will improve Description: Including pain rating scale, medication(s)/side effects and non-pharmacologic comfort measures Outcome: Completed/Met   Problem: Activity: Goal: Risk for activity intolerance will decrease Outcome: Completed/Met   Problem: Pain Managment: Goal: General experience of comfort will improve Outcome: Completed/Met   

## 2021-06-19 NOTE — Plan of Care (Signed)
  Problem: Coping: Goal: Level of anxiety will decrease Outcome: Progressing   Problem: Pain Managment: Goal: General experience of comfort will improve Outcome: Progressing   

## 2021-06-20 MED ORDER — HYDROMORPHONE HCL 1 MG/ML IJ SOLN: 1.0000 mg | INTRAMUSCULAR | Status: DC | PRN

## 2021-06-20 MED ORDER — PANTOPRAZOLE SODIUM 40 MG PO TBEC
40.0000 mg | DELAYED_RELEASE_TABLET | Freq: Every day | ORAL | Status: DC
  Administered 2021-06-20 – 2021-06-21 (×2): 40 mg via ORAL
  Filled 2021-06-20 (×2): qty 1

## 2021-06-20 MED ORDER — ACETAMINOPHEN 500 MG PO TABS
1000.0000 mg | ORAL_TABLET | Freq: Once | ORAL | Status: AC
  Administered 2021-06-20: 1000 mg via ORAL
  Filled 2021-06-20: qty 2

## 2021-06-20 MED ORDER — OXYCODONE HCL 5 MG PO TABS
5.0000 mg | ORAL_TABLET | ORAL | Status: DC | PRN
  Administered 2021-06-20 (×2): 5 mg via ORAL
  Administered 2021-06-20 (×2): 10 mg via ORAL
  Administered 2021-06-21 (×2): 5 mg via ORAL
  Administered 2021-06-21: 10 mg via ORAL
  Administered 2021-06-21: 5 mg via ORAL
  Administered 2021-06-21 – 2021-06-22 (×4): 10 mg via ORAL
  Administered 2021-06-22: 5 mg via ORAL
  Filled 2021-06-20 (×3): qty 2
  Filled 2021-06-20: qty 1
  Filled 2021-06-20 (×2): qty 2
  Filled 2021-06-20 (×2): qty 1
  Filled 2021-06-20: qty 2
  Filled 2021-06-20: qty 1
  Filled 2021-06-20 (×2): qty 2
  Filled 2021-06-20: qty 1
  Filled 2021-06-20: qty 2

## 2021-06-20 MED ORDER — METHOCARBAMOL 500 MG PO TABS: 500.0000 mg | ORAL_TABLET | Freq: Four times a day (QID) | ORAL | Status: DC | PRN

## 2021-06-20 NOTE — Progress Notes (Signed)
6 Days Post-Op  Subjective: CC: Doing well. Some soreness of her abdomen that she says is mild and improved, seems like it is mainly around her incision. She is still using IV pain medications. No PO pain meds ordered. Discussed weaning IV pain medications today. Tolerating diet without n/v. Ambulating halls without assistance. Voiding without difficulty.   Patient reports that she emptied her ostomy x 2 yesterday. Has not changed it. Husband was here on Friday to learn as well. Per notes he "got flushed with ostomy pouch change and had to sit down.". Discussed with WOCN. Sounds like it is in a difficult place to pouch for the patient when she is lying down and may need to learn standing up in front of a mirror.   Spoke with TOC who said they have not been able to secure [facility] RN or Home vac.   Objective: Vital signs in last 24 hours: Temp:  [98 F (36.7 C)-99.1 F (37.3 C)] 98 F (36.7 C) (01/30 0618) Pulse Rate:  [77-97] 77 (01/30 0618) Resp:  [20] 20 (01/30 0618) BP: (142-177)/(90-93) 150/93 (01/30 0618) SpO2:  [96 %-97 %] 97 % (01/30 0618) Last BM Date: 06/20/21  Intake/Output from previous day: 01/29 0701 - 01/30 0700 In: 1228.1 [P.O.:600; I.V.:478.1; IV Piggyback:150] Out: 125 [Drains:25; Stool:100] Intake/Output this shift: No intake/output data recorded.  PE: Gen:  Alert, NAD, pleasant Card:  Reg Pulm:  CTA b/l. Normal rate and effort on RA Abd: Soft, ND, approprately tender, +BS. Ostomy bag in place with brown semi-solid stool in bag. Unable to see stoma 2/2 stool. Wound vac in place, ss drainage in cannister  Ext:  No LE edema or calf tenderness Psych: A&Ox3  Skin: no rashes noted, warm and dry  Seen with WOCN after initial visit for vac and ostomy change.  Midline wound clean. No dehiscence     Stoma Viable. No mucocutaneous separation noted.      Lab Results:  No results for input(s): WBC, HGB, HCT, PLT in the last 72 hours. BMET No results for  input(s): NA, K, CL, CO2, GLUCOSE, BUN, CREATININE, CALCIUM in the last 72 hours. PT/INR No results for input(s): LABPROT, INR in the last 72 hours. CMP     Component Value Date/Time   NA 135 06/16/2021 0300   K 3.6 06/16/2021 0300   CL 99 06/16/2021 0300   CO2 28 06/16/2021 0300   GLUCOSE 124 (H) 06/16/2021 0300   BUN 11 06/16/2021 0300   CREATININE 0.55 06/16/2021 0300   CALCIUM 8.5 (L) 06/16/2021 0300   PROT 8.4 (H) 06/13/2021 1700   ALBUMIN 3.6 06/13/2021 1700   AST 16 06/13/2021 1700   ALT 13 06/13/2021 1700   ALKPHOS 118 06/13/2021 1700   BILITOT 1.2 06/13/2021 1700   GFRNONAA >60 06/16/2021 0300   Lipase  No results found for: LIPASE  Studies/Results: No results found.  Anti-infectives: Anti-infectives (From admission, onward)    Start     Dose/Rate Route Frequency Ordered Stop   06/14/21 2000  piperacillin-tazobactam (ZOSYN) IVPB 3.375 g        3.375 g 12.5 mL/hr over 240 Minutes Intravenous Every 8 hours 06/14/21 1922     06/14/21 0600  piperacillin-tazobactam (ZOSYN) IVPB 3.375 g  Status:  Discontinued        3.375 g 12.5 mL/hr over 240 Minutes Intravenous Every 8 hours 06/13/21 2149 06/14/21 1906   06/13/21 2200  piperacillin-tazobactam (ZOSYN) IVPB 3.375 g  Status:  Discontinued  3.375 g 12.5 mL/hr over 240 Minutes Intravenous Every 8 hours 06/13/21 2147 06/13/21 2148   06/13/21 1715  cefTRIAXone (ROCEPHIN) 2 g in sodium chloride 0.9 % 100 mL IVPB        2 g 200 mL/hr over 30 Minutes Intravenous  Once 06/13/21 1700 06/13/21 1834   06/13/21 1715  metroNIDAZOLE (FLAGYL) IVPB 500 mg        500 mg 100 mL/hr over 60 Minutes Intravenous  Once 06/13/21 1700 06/13/21 1902        Assessment/Plan POD 6 s/p Hartman's for perforated colon secondary to dental appliance by Dr. Gerrit Friends - 06/14/21 - Doing well. Tolerating diet and having bowel function - WBC downtrending on last lab check. Afebrile. Completed 5d post op abx. Will discuss with MD if can d/c -  Wean IV pain meds. Started PO pain meds - WOC teaching - patient emptying her ostomy bag. Has not changed it. TOC working on [facility] RN but may not be able to be arranged. Having teaching with WOCN today. They are coming up with a strategy that will allow for the husband to assist her with pouch change at home. After discussion she may also be able to learn how to change herself in front of a mirror. Will refer to ostomy clinic for continued ostomy assistance after d/c.  - Cont vac, M/W/F. Awaiting to see if [facility] RN and Home vac can be arranged. If unable to be arranged will need to switch to WTD BID and teach patient and husband how to perform this at home.  - Mobilize - Pulm toilet  FEN - Reg, d/c IVF VTE - SCDs, Lovenox ID - Zosyn Foley - None   This care required moderate level of medical decision making.    LOS: 7 days    Jacinto Halim , [facility] Surgery 06/20/2021, 9:07 AM Please see Amion for pager number during day hours 7:00am-4:30pm

## 2021-06-20 NOTE — Plan of Care (Signed)
  Problem: Activity: Goal: Risk for activity intolerance will decrease Outcome: Progressing   Problem: Pain Managment: Goal: General experience of comfort will improve Outcome: Progressing   Problem: Safety: Goal: Ability to remain free from injury will improve Outcome: Progressing   

## 2021-06-20 NOTE — Consult Note (Signed)
WOC Nurse wound follow up Wound type:Surgical Measurement:16cm x 5cm x 6cm Wound bed: clean, dry (See photo) Drainage (amount, consistency, odor) small serous Periwound: intact Dressing procedure/placement/frequency: NPWT with 3 times/week dressing changes.  CCS PA-Cs at bedside for dressing change. Photo taken for EHR.  Small degree of false bottom is easily separated using cotton-tipped applicator. No bleeding. NPWT dressing is taken down, spray adhesive remover is used to decrease trauma.Two pieces of black foam and two wedges of skin barrier ring at 3 and 9 o'clock removed. Wound cleansed with NS, patted dry. One-half skin barrier ring placed at 3 o'clock to full defect made by umbilicus and crease, another placed at crease at 9 o'clock.  Wound filled with 2 pieces of black foam to completely obliterate wound depth and secured with drape. Dressing is attached to negative pressure via T.R.A.C.C. pad and an immediate seal is obtained at continues negative pressure.  The patient tolerated the procedure well and voiced minimal discomfort. She resumed eating breakfast at the conclusion of the procedure.    WOC Nurse ostomy follow up Stoma type/location: LLQ Colostomy  Stomal assessment/size: Oval, 1 and 1/8 inch x 1 and 3/4 inch, dark, but moist and functioning. Lumen in center. Lateral edge at skin level.  No evidence of mucocutaneous separation. Peristomal assessment: intact, mild peristoma erythema Treatment options for stomal/peristomal skin: Skin barrier ring Output: brown stool Ostomy pouching: 2pc. 2 and 1/4 inch pouching system with skin barrier ring.   Education provided:  Demonstrated pouch change (cutting new skin barrier, measuring stoma, cleaning peristomal skin and stoma, use of barrier ring): Patient assisted with tracing and cutting new skin barrier for next pouch change today.  Assisted with application by placing bottom edge of skin barrier against bottom edge of stoma  (over ring) and then just letting the upper part of skin barrier fall into place. Education on emptying when 1/3 to 1/2 full and how to empty. Patient has been emptying independently a few times since yesterday. She says it is slow and a little messy, but she will get better with practice. Discussed bathing, diet, gas, medication use, constipation: Patient understand that she may shower and that she can do so with pouch on or off (she says she would not take off). She understands that those foods that caused gas previously will cause gas now. Discussed risk of peristomal hernia: that she should not life more than 15 pounds and that she should brace her abdomen while coughing.  Enrolled patient in [place] Secure Start Discharge program: Yes  Supplies prepared for discharge:  8 skin barriers, 8 pouches and 12 skin barrier rings prepared for discharge.  Patterns (cardboard and plastic), scissors and pen supplied.  WOC nursing team will follow, and will remain available to this patient, the nursing and medical teams.    Thanks, Ladona Mow, MSN, RN, GNP, [place]  Pager# [phone]

## 2021-06-20 NOTE — TOC Progression Note (Signed)
Transition of Care [facility]) - Progression Note   Patient Details  Name: Misty Peck MRN: [id number] Date of Birth: July 12, 1975  Transition of [facility]) CM/SW Contact  Ewing Schlein, LCSW Phone Number: 06/20/2021, 1:59 PM  Clinical Narrative: CSW made referrals for [facility] with an LOG to the following agencies:  [facility]: declined [facility]: declined, does not accept LOGs [facility]: declined due to staffing [facility]: declined [facility]: declined [facility]: declined (rep could not confirm if agency accepts LOGs) [facility]: declined  CSW updated surgical PA that all [facility] agencies have now declined the patient. As the patient will not have HHRN, the PA informed CSW the [facility] wound vac can be cancelled. CSW called [place] with [facility] to cancel wound care. Patient will go home with wet-to-dry dressings. CSW updated patient on HH and wound vac.  Expected Discharge Plan: Home/Self Care Barriers to Discharge: Continued Medical Work up, Inadequate or no insurance, No Home Care Agency will accept this patient  Expected Discharge Plan and Services Expected Discharge Plan: Home/Self Care In-house Referral: Clinical Social Work Living arrangements for the past 2 months: Single Family Home               DME Arranged: Ostomy supplies, Negative pressure wound device DME Agency: [facility] Date DME Agency Contacted: 06/16/21 Time DME Agency Contacted: 1330 Representative spoke with at DME Agency: Blossom Hoops  Readmission Risk Interventions Readmission Risk Prevention Plan 06/16/2021  Post Dischage Appt Complete  Medication Screening Complete  Transportation Screening Complete  Some recent data might be hidden

## 2021-06-20 NOTE — Plan of Care (Signed)
  Problem: Coping: Goal: Level of anxiety will decrease Outcome: Progressing   Problem: Pain Managment: Goal: General experience of comfort will improve Outcome: Progressing   

## 2021-06-20 NOTE — Plan of Care (Signed)

## 2021-06-21 LAB — CREATININE, SERUM
Creatinine, Ser: 0.69 mg/dL (ref 0.44–1.00)
GFR, Estimated: >60 mL/min (ref >60)

## 2021-06-21 MED ORDER — GUAIFENESIN-DM 100-10 MG/5ML PO SYRP
10.0000 mL | ORAL_SOLUTION | ORAL | Status: DC | PRN
  Administered 2021-06-21: 10 mL via ORAL
  Filled 2021-06-21: qty 10

## 2021-06-21 MED ORDER — ADULT MULTIVITAMIN W/MINERALS CH
1.0000 | ORAL_TABLET | Freq: Every day | ORAL | Status: DC
  Administered 2021-06-21 – 2021-06-22 (×2): 1 via ORAL
  Filled 2021-06-21 (×2): qty 1

## 2021-06-21 MED ORDER — ENSURE SURGERY PO LIQD
237.0000 mL | Freq: Two times a day (BID) | ORAL | Status: DC
  Administered 2021-06-21 – 2021-06-22 (×3): 237 mL via ORAL

## 2021-06-21 MED ORDER — ALBUTEROL SULFATE (2.5 MG/3ML) 0.083% IN NEBU: 3.0000 mL | INHALATION_SOLUTION | Freq: Four times a day (QID) | RESPIRATORY_TRACT | Status: DC | PRN

## 2021-06-21 MED ORDER — ACETAMINOPHEN 500 MG PO TABS: 1000.0000 mg | ORAL_TABLET | Freq: Four times a day (QID) | ORAL | Status: DC | PRN

## 2021-06-21 NOTE — TOC Progression Note (Signed)
Transition of Care [facility]) - Progression Note    Patient Details  Name: Misty Peck MRN: [id number] Date of Birth: Apr 26, 1976  Transition of Care [facility]) CM/SW Contact  Lennart Pall, LCSW Phone Number: 06/21/2021, 2:20 PM  Clinical Narrative:    Met with pt this morning who reports that the family has a person (a Therapist, sports per family) who is providing private duty care to relatives that has indicated that she would be willing to manage wound VAC dressing changes for pt at home (they would pay privately for these visits).  Have spoken with CCS and Ong team as well as [employer] rep.  All are agreed that this is a valid possibility as long as this person can return demonstrate that she is competent with the dressing changes.   Have arranged for Guerry Minors to be here tomorrow at 11am to perform dressing changes with WOC RN.  [employer] does ask that pt then be seen q 2 weeks by MD to monitor wound an get current measurements - CCS is aware and will plan to see patient in clinic q 2 wks.  [employer] to have VAC delivered to room by tomorrow morning.  Will continue to follow.   Expected Discharge Plan: Home/Self Care Barriers to Discharge: Continued Medical Work up, Inadequate or no insurance, No Home Care Agency will accept this patient  Expected Discharge Plan and Services Expected Discharge Plan: Home/Self Care In-house Referral: Clinical Social Work     Living arrangements for the past 2 months: Single Family Home                 DME Arranged: Ostomy supplies, Negative pressure wound device DME Agency: [employer] Date DME Agency Contacted: 06/16/21 Time DME Agency Contacted: 1330 Representative spoke with at DME Agency: Leontine Locket             Social Determinants of Health ([place]) Interventions    Readmission Risk Interventions Readmission Risk Prevention Plan 06/16/2021  Post Dischage Appt Complete  Medication Screening Complete  Transportation Screening Complete  Some recent data might be hidden

## 2021-06-21 NOTE — Plan of Care (Signed)
  Problem: Safety: Goal: Ability to remain free from injury will improve Outcome: Progressing   Problem: Pain Managment: Goal: General experience of comfort will improve Outcome: Progressing   Problem: Skin Integrity: Goal: Risk for impaired skin integrity will decrease Outcome: Progressing   

## 2021-06-21 NOTE — Progress Notes (Signed)
7 Days Post-Op  Subjective: CC: Doing well. Stable, mild abdominal pain that is well controlled. Tolerating diet without n/v. Having bowel function. Emptying her ostomy bag. Mobilizing in halls without assistance. Voiding without difficulty.   Objective: Vital signs in last 24 hours: Temp:  [97.9 F (36.6 C)-98.6 F (37 C)] 98.6 F (37 C) (01/31 0547) Pulse Rate:  [81-108] 81 (01/31 0547) Resp:  [18] 18 (01/31 0547) BP: (126-187)/(74-107) 145/78 (01/31 0547) SpO2:  [97 %-98 %] 98 % (01/31 0547) Last BM Date: 06/20/21  Intake/Output from previous day: No intake/output data recorded. Intake/Output this shift: No intake/output data recorded.  PE: Gen:  Alert, NAD, pleasant Card:  Reg Pulm:  CTA b/l. Normal rate and effort on RA Abd: Soft, ND, approprately tender, +BS. Ostomy bag in place with brown semi-solid stool in bag. Unable to see stoma 2/2 stool. Wound vac in place, ss drainage in cannister  Ext:  No LE edema or calf tenderness Psych: A&Ox3  Skin: no rashes noted, warm and dry  Lab Results:  No results for input(s): WBC, HGB, HCT, PLT in the last 72 hours. BMET Recent Labs    06/21/21 0313  CREATININE 0.69   PT/INR No results for input(s): LABPROT, INR in the last 72 hours. CMP     Component Value Date/Time   NA 135 06/16/2021 0300   K 3.6 06/16/2021 0300   CL 99 06/16/2021 0300   CO2 28 06/16/2021 0300   GLUCOSE 124 (H) 06/16/2021 0300   BUN 11 06/16/2021 0300   CREATININE 0.69 06/21/2021 0313   CALCIUM 8.5 (L) 06/16/2021 0300   PROT 8.4 (H) 06/13/2021 1700   ALBUMIN 3.6 06/13/2021 1700   AST 16 06/13/2021 1700   ALT 13 06/13/2021 1700   ALKPHOS 118 06/13/2021 1700   BILITOT 1.2 06/13/2021 1700   GFRNONAA >60 06/21/2021 0313   Lipase  No results found for: LIPASE  Studies/Results: No results found.  Anti-infectives: Anti-infectives (From admission, onward)    Start     Dose/Rate Route Frequency Ordered Stop   06/14/21 2000   piperacillin-tazobactam (ZOSYN) IVPB 3.375 g  Status:  Discontinued        3.375 g 12.5 mL/hr over 240 Minutes Intravenous Every 8 hours 06/14/21 1922 06/20/21 1109   06/14/21 0600  piperacillin-tazobactam (ZOSYN) IVPB 3.375 g  Status:  Discontinued        3.375 g 12.5 mL/hr over 240 Minutes Intravenous Every 8 hours 06/13/21 2149 06/14/21 1906   06/13/21 2200  piperacillin-tazobactam (ZOSYN) IVPB 3.375 g  Status:  Discontinued        3.375 g 12.5 mL/hr over 240 Minutes Intravenous Every 8 hours 06/13/21 2147 06/13/21 2148   06/13/21 1715  cefTRIAXone (ROCEPHIN) 2 g in sodium chloride 0.9 % 100 mL IVPB        2 g 200 mL/hr over 30 Minutes Intravenous  Once 06/13/21 1700 06/13/21 1834   06/13/21 1715  metroNIDAZOLE (FLAGYL) IVPB 500 mg        500 mg 100 mL/hr over 60 Minutes Intravenous  Once 06/13/21 1700 06/13/21 1902        Assessment/Plan POD 7 s/p Hartman's for perforated colon secondary to dental appliance by Dr. Gerrit Friends - 06/14/21 - Doing well. Tolerating diet and having bowel function - WBC downtrending on last lab check. Afebrile. Completed 5d post op abx. - Pain controlled with Po medications - WOC teaching undergoing. Patient able to empty pouch herself and assisted with application yesterday. Unable to  get [facility] RN for ostomy care. Discussed with WOCN who has arranged ostomy clinic f/u on Friday  - Cont vac, M/W/F for now. Yesterday we were unable to get [facility] RN arranged so home vac cancelled and plan was to switch to wtd at d/c. Patient reports that she found a HH agency that takes care of her grandparents and is willing to perform wound vac changes 3x/week after d/c for private pay. CM is reaching out to contact, Ellouise Newer [phone]) to verify [facility] agency, qualification and ensure they will take care of this after d/c. Patient has given permission to reach out. If able to verify this a qualified agency that accepts responsibility to take care of this at d/c, will ask CM to  reopen home vac request w/ [employer]. Discussed with patient if we cannot verify qualification or get home vac approval, we will need to switch to WTD. Her husband can come at 1pm today for dressing change teaching if needed.  - Mobilize - Pulm toilet - Possible d/c later this afternoon if above can be arranged.   FEN - Reg, SLIV VTE - SCDs, Lovenox ID - Zosyn completed 5d post op. None currently.  Foley - None   This care required moderate level of medical decision making.    LOS: 8 days    Jacinto Halim , [facility] Surgery 06/21/2021, 10:11 AM Please see Amion for pager number during day hours 7:00am-4:30pm

## 2021-06-21 NOTE — Consult Note (Addendum)
WOC Nurse wound follow up Discussed with TOC and patient's friend Margaretha Glassing is a Engineer, civil (consulting) with NPWT dressing change experience.  She will meet me at the bedside tomorrow morning at 9am to give a demonstration of her competence with this dressing change.   Time changed to 11am.  WOC nursing team will follow, and will remain available to this patient, the nursing and medical teams.  Please re-consult if needed Ladona Mow, MSN, RN, Perlie Gold, [place]  Pager# [phone]

## 2021-06-21 NOTE — Progress Notes (Signed)
Nutrition Follow-up  INTERVENTION:   -Ensure Surgery PO BID, each provides 330 kcals and 18g protein   -Multivitamin with minerals daily  NUTRITION DIAGNOSIS:   Inadequate oral intake related to inability to eat, decreased appetite as evidenced by per patient/family report, NPO status.  Now on regular diet, consuming 100%  GOAL:   Patient will meet greater than or equal to 90% of their needs  Progressing.  MONITOR:   PO intake, Supplement acceptance, Labs, Weight trends, I & O's, Skin   ASSESSMENT:   46 y.o. female with hx of tobacco abuse transferred from outside hospital after CT scan showed a foreign body in the sigmoid colon with focal perforation.  1/23: admitted, NPO 1/24: s/p flexible sigmoidoscopy, ex lap, sigmoid colectomy, colostomy 1/27: Wound VAC placed, Clear liquids 1/28: FLD 1/29: Regular diet  Patient currently consuming 100% of meals. Per surgery note, pt may discharge today if [facility] can be arranged. Given open abdominal incision and increase needs post-op, will order protein shakes and daily MVI.  Admission weight: 160 lbs. No other weights this admission.  Medications reviewed.  Labs reviewed.  Diet Order:   Diet Order             Diet regular Room service appropriate? Yes; Fluid consistency: Thin  Diet effective now                   EDUCATION NEEDS:   No education needs have been identified at this time  Skin:  Skin Assessment: Skin Integrity Issues: Skin Integrity Issues:: Incisions Incisions: 1/27 open abdomen -Wound VAC  Last BM:  1/30 -colostomy  Height:   Ht Readings from Last 1 Encounters:  06/14/21 5' (1.524 m)    Weight:   Wt Readings from Last 1 Encounters:  06/14/21 72.6 kg    BMI:  Body mass index is 31.25 kg/m.  Estimated Nutritional Needs:   Kcal:  1600-1800 kcal/d  Protein:  80-90 g/d  Fluid:  1.8-2 L/d   Tilda Franco, MS, RD, LDN Inpatient Clinical Dietitian Contact information available via  Amion

## 2021-06-22 MED ORDER — ADULT MULTIVITAMIN W/MINERALS CH: 1.0000 | ORAL_TABLET | Freq: Every day | ORAL | Status: AC

## 2021-06-22 MED ORDER — OXYCODONE HCL 5 MG PO TABS: 5.0000 mg | ORAL_TABLET | Freq: Four times a day (QID) | ORAL | 0 refills | Status: AC | PRN

## 2021-06-22 MED ORDER — METHOCARBAMOL 500 MG PO TABS: 500.0000 mg | ORAL_TABLET | Freq: Four times a day (QID) | ORAL | 0 refills | Status: DC | PRN

## 2021-06-22 MED ORDER — ACETAMINOPHEN 500 MG PO TABS: 1000.0000 mg | ORAL_TABLET | Freq: Three times a day (TID) | ORAL | 0 refills | Status: AC | PRN

## 2021-06-22 NOTE — Discharge Summary (Signed)
Patient ID: Misty Peck [id number] 1975-07-24 46 y.o.  Admit date: 06/13/2021 Discharge date: 06/22/2021  Admitting Diagnosis: Focal perforation of the sigmoid colon from a foreign body.  Discharge Diagnosis S/p Hartman's for perforated colon secondary to dental appliance by Dr. Gerrit Friends - 06/14/21  Consultants Gastroenterology  Reason for Admission: Misty Peck is an 46 y.o. female who is transferred from med [facility].  She presented with a several day history of lower abdominal pain which she described as intermittent and cramping.  She also had some chills and fevers.  She had taken laxatives and had a bowel movement on Saturday.  She was found to have an elevated white blood count on laboratory data.  She then underwent a CT scan of the abdomen pelvis showing a foreign body in the sigmoid colon with focal perforation.  After discussing the CAT scan findings with the patient, she admitted that she likely swallowed her partial dentures over a week ago.  She just started having abdominal discomfort on Friday or Saturday.  She reports her pain again is intermittent and is now currently a 2 out of 10.  She reports having had dark urine.  Procedures Dr. Marca Ancona - Flex sig - 06/14/21  Dr. Gerrit Friends - exploratory laparotomy, sigmoid colectomy, descending colostomy - 06/14/2021  Hospital Course:  Patient presented as above.  CT showed foreign body in the sigmoid colon with focal perforation.  This suspected to be from the patient swallowing partial dentures for a week ago.  She was admitted to the general surgery service.  GI was consulted.  Dr. Marca Ancona of GI performed flex sig and was able to visualize retained foreign body that was embedded in the colonic wall with area of ulceration and perforation noted.  Patient was then taken emergently to the OR by Dr. Gerrit Friends and underwent exploratory laparotomy, sigmoid colectomy and descending colostomy.  Patient tolerated the procedure well.   Patient had ileus after procedure that was monitored and resolved.  NG tube was removed and diet was advanced and tolerated. Patient completed 5d post op abx. VAC was placed for midline wound while in the hospital.  [facility] RN was unable to be arranged.  Family attempted to find private pay [facility] RN so patient could go home with wound VAC, but this ultimately was not able to be arranged.  Patient was switched to wet-to-dry.  Family learned WTD at bedside. On POD 8, the patient was voiding well, tolerating diet, ambulating well, pain well controlled, vital signs stable and felt stable for discharge home. Return precautions and discharge instructions discussed.  Follow-up as noted below.  We were able to get her in with the ostomy clinic for this Friday.  Dr. Laurey Morale note does mention she would like to perform a colonoscopy at appointment to be scheduled as an outpatient and this will likely need to be done in the next 2-3 months after surgery, as she had a rectal polyp which was not removed and she has never had a full colonoscopy. I have communicated this with our office. Our office with reach out to [facility] to ensure patient has follow up with them.   Allergies as of 06/22/2021   No Known Allergies      Medication List     TAKE these medications    acetaminophen 500 MG tablet Commonly known as: TYLENOL Take 2 tablets (1,000 mg total) by mouth every 8 (eight) hours as needed for mild pain.   ibuprofen 200  MG tablet Commonly known as: ADVIL Take 600 mg by mouth every 6 (six) hours as needed (for pain).   levonorgestrel 20 MCG/DAY Iud Commonly known as: MIRENA 1 each by Intrauterine route once.   methocarbamol 500 MG tablet Commonly known as: ROBAXIN Take 1 tablet (500 mg total) by mouth every 6 (six) hours as needed for muscle spasms.   multivitamin with minerals Tabs tablet Take 1 tablet by mouth daily. Start taking on: June 23, 2021   oxyCODONE 5 MG immediate release tablet Commonly  known as: Oxy IR/ROXICODONE Take 1 tablet (5 mg total) by mouth every 6 (six) hours as needed for breakthrough pain.               Durable Medical Equipment  (From admission, onward)           Start     Ordered   06/20/21 0932  For home use only DME Negative pressure wound device  Once       Question Answer Comment  Frequency of dressing change 3 times per week   Length of need 3 Months   Dressing type Foam   Amount of suction 125 mm/Hg   Pressure application Continuous pressure   Supplies 10 canisters and 15 dressings per month for duration of therapy      06/20/21 0931              Follow-up Information     Darnell Level, MD Follow up on 07/07/2021.   Specialty: General Surgery Why: arrive3:45pm for a 4:15pm appointment time for your follow up.  this time will allow for paperwork and check in process Contact information: [address] [place] [address] [phone]         [facility]-WOUND OSTOMY CONTINENCE Follow up on 06/24/2021.   Why: follow up in wound ostomy clinic on Friday 2/3 at 11:30 am come to [facility] entrance of [facility], Entrance A.  Free valet parking is available. check at front desk and inform them you have an ostomy clinic appointment        Physicians, [facility] Family Follow up.   Specialty: Family Medicine Contact information: [address] [place] [address] [phone]                 Signed: Leary Roca, [facility] Surgery 06/22/2021, 5:17 PM Please see Amion for pager number during day hours 7:00am-4:30pm

## 2021-06-22 NOTE — Progress Notes (Signed)
Discharge package printed and instructions given to patient and husband. All questions were answered to pt and husband satisfaction.

## 2021-06-22 NOTE — Plan of Care (Signed)
  Problem: Pain Managment: Goal: General experience of comfort will improve Outcome: Progressing   Problem: Safety: Goal: Ability to remain free from injury will improve Outcome: Progressing   Problem: Skin Integrity: Goal: Risk for impaired skin integrity will decrease Outcome: Progressing   

## 2021-06-22 NOTE — Plan of Care (Signed)
  Problem: Activity: Goal: Risk for activity intolerance will decrease Outcome: Progressing   Problem: Nutrition: Goal: Adequate nutrition will be maintained Outcome: Progressing   Problem: Pain Managment: Goal: General experience of comfort will improve Outcome: Progressing   Problem: Safety: Goal: Ability to remain free from injury will improve Outcome: Progressing   

## 2021-06-22 NOTE — Progress Notes (Addendum)
8 Days Post-Op  Subjective: CC: Doing well. Stable, mild abdominal pain that is well controlled. Tolerating diet without n/v. Having bowel function. Emptying her ostomy bag. Mobilizing in halls without assistance. Voiding without difficulty. No other complaints.   Objective: Vital signs in last 24 hours: Temp:  [98.4 F (36.9 C)-98.7 F (37.1 C)] 98.4 F (36.9 C) (02/01 0538) Pulse Rate:  [77-95] 77 (02/01 0538) Resp:  [17] 17 (02/01 0538) BP: (146-176)/(78-109) 146/91 (02/01 0538) SpO2:  [96 %-97 %] 96 % (02/01 0538) Last BM Date: 06/21/21  Intake/Output from previous day: No intake/output data recorded. Intake/Output this shift: No intake/output data recorded.  PE: Gen:  Alert, NAD, pleasant Card:  Reg Pulm:  CTA b/l. Normal rate and effort on RA Abd: Soft, ND, approprately tender, +BS. Ostomy bag in place with brown semi-solid stool in bag. Unable to see stoma 2/2 stool. Wound vac in place, ss drainage in cannister  Psych: A&Ox3   Lab Results:  No results for input(s): WBC, HGB, HCT, PLT in the last 72 hours. BMET Recent Labs    06/21/21 0313  CREATININE 0.69   PT/INR No results for input(s): LABPROT, INR in the last 72 hours. CMP     Component Value Date/Time   NA 135 06/16/2021 0300   K 3.6 06/16/2021 0300   CL 99 06/16/2021 0300   CO2 28 06/16/2021 0300   GLUCOSE 124 (H) 06/16/2021 0300   BUN 11 06/16/2021 0300   CREATININE 0.69 06/21/2021 0313   CALCIUM 8.5 (L) 06/16/2021 0300   PROT 8.4 (H) 06/13/2021 1700   ALBUMIN 3.6 06/13/2021 1700   AST 16 06/13/2021 1700   ALT 13 06/13/2021 1700   ALKPHOS 118 06/13/2021 1700   BILITOT 1.2 06/13/2021 1700   GFRNONAA >60 06/21/2021 0313   Lipase  No results found for: LIPASE  Studies/Results: No results found.  Anti-infectives: Anti-infectives (From admission, onward)    Start     Dose/Rate Route Frequency Ordered Stop   06/14/21 2000  piperacillin-tazobactam (ZOSYN) IVPB 3.375 g  Status:   Discontinued        3.375 g 12.5 mL/hr over 240 Minutes Intravenous Every 8 hours 06/14/21 1922 06/20/21 1109   06/14/21 0600  piperacillin-tazobactam (ZOSYN) IVPB 3.375 g  Status:  Discontinued        3.375 g 12.5 mL/hr over 240 Minutes Intravenous Every 8 hours 06/13/21 2149 06/14/21 1906   06/13/21 2200  piperacillin-tazobactam (ZOSYN) IVPB 3.375 g  Status:  Discontinued        3.375 g 12.5 mL/hr over 240 Minutes Intravenous Every 8 hours 06/13/21 2147 06/13/21 2148   06/13/21 1715  cefTRIAXone (ROCEPHIN) 2 g in sodium chloride 0.9 % 100 mL IVPB        2 g 200 mL/hr over 30 Minutes Intravenous  Once 06/13/21 1700 06/13/21 1834   06/13/21 1715  metroNIDAZOLE (FLAGYL) IVPB 500 mg        500 mg 100 mL/hr over 60 Minutes Intravenous  Once 06/13/21 1700 06/13/21 1902        Assessment/Plan POD 8 s/p Hartman's for perforated colon secondary to dental appliance by Dr. Gerrit FriendsGerkin - 06/14/21 - Doing well. Tolerating diet and having bowel function - WBC downtrending on last lab check. Afebrile. Completed 5d post op abx. - Pain controlled with Po medications - WOC teaching undergoing. Patient able to empty pouch herself and assisted with application earlier this week. Unable to get [facility] RN for ostomy care. Discussed with WOCN who  has arranged ostomy clinic f/u on Friday  - Cont vac, M/W/F for now. Patient denied for [facility] RN. She reports that she found a private [facility] RN that takes care of her grandparents and is willing to perform wound vac changes after d/c for private pay. CM has been in contact, [employer] [phone]) and [employer]. Appreciate my colleague coordinating wound vac vs wtd at d/c yesterday while I was off. Current status is patient and her family are trying to see if Guerry Minors can do three time weekly dressing changes. If so, we would plan for Guerry Minors to come in and work with our woc team to ensure competency in wound vac changes. If this is unable to be arranged, would plan to switch to wtd bid  at d/c and her husband would come in for dressing change teaching today. Patient understands and agree's to this plan.  - Mobilize - Pulm toilet - D/c today with wound vac vs wtd as above. She is otherwise medically stable for d/c.    Addendum: Family reports they have been able to secure three times weekly dressing changes at discharge. Guerry Minors will be able to perform three times dressing changes at discharge most weeks and will occasionally need her partner who is reported to also be a trained RN in wound vac changes to assist with the third dressing change per week. I spoke with my attending and if patient can have Guerry Minors come in to demonstrate competency with wound vac change today with WOCN, okay with discharge with home vac today. Family also will give information on other RN who will be assisting Loretta with wound vac changes at discharge.   FEN - Reg, SLIV VTE - SCDs, Lovenox ID - Zosyn completed 5d post op. None currently.  Foley - None    This care required moderate level of medical decision making.    LOS: 9 days    Jillyn Ledger , [facility] Surgery 06/22/2021, 9:13 AM Please see Amion for pager number during day hours 7:00am-4:30pm

## 2021-06-22 NOTE — Consult Note (Signed)
WOC Nurse Consult Note: No friend available to come for demonstration of competency in NPWT dressing change.  NPWT dressing taken down in anticipation of discharge today. Patent's husband is instructed in normal saline dressing change by bedside RN.  Supplies assembled for a minimum of 5 days of twice daily dressing changes (Normal Saline, gauze 4x4s, cotton tipped applicators, ABD pads, Kerlix roll gauze and Medipore tape).  WOC nursing team will follow until discharge, and will remain available to this patient, the nursing and medical teams.   Thanks, Ladona Mow, MSN, RN, GNP, [place]  Pager# [phone]

## 2021-06-22 NOTE — TOC Transition Note (Signed)
Transition of Care [facility]) - CM/SW Discharge Note   Patient Details  Name: Misty Peck MRN: [id number] Date of Birth: December 10, 1975  [facility]) CM/SW Contact:  Amada Jupiter, LCSW Phone Number: 06/22/2021, 3:27 PM   Clinical Narrative:    Pt and spouse report that the person Margaretha Glassing) who was to learn and perform the wound VAC dressing changes has now declined the offer to pt and family.  Have cancelled the [facility] from [employer].  Spouse taught W-D dressing changes and pt to dc home today (please see WOC note for more info).  No further TOC needs.   Final next level of care: Home/Self Care Barriers to Discharge: No Home Care Agency will accept this patient   Patient Goals and CMS Choice Patient states their goals for this hospitalization and ongoing recovery are:: return home      Discharge Placement                       Discharge Plan and Services In-house Referral: Clinical Social Work              DME Arranged: Ostomy supplies (WOC arranged via Occupational psychologist)           Social Determinants of Health ([place]) Interventions     Readmission Risk Interventions Readmission Risk Prevention Plan 06/16/2021  Post Dischage Appt Complete  Medication Screening Complete  Transportation Screening Complete  Some recent data might be hidden

## 2021-06-22 NOTE — Progress Notes (Signed)
RN educated and observed Pt's husband change the abdominal wound dressing per order.

## 2021-06-24 ENCOUNTER — Other Ambulatory Visit: Payer: Self-pay

## 2021-06-24 ENCOUNTER — Ambulatory Visit (HOSPITAL_COMMUNITY)
Admit: 2021-06-24 | Discharge: 2021-06-24 | Disposition: A | Discharge status: Home / Self Care | Payer: Self-pay | Attending: Nurse Practitioner | Admitting: Nurse Practitioner

## 2021-06-24 DIAGNOSIS — L24B1 Irritant contact dermatitis related to digestive stoma or fistula: Secondary | ICD-10-CM

## 2021-06-24 DIAGNOSIS — L259 Unspecified contact dermatitis, unspecified cause: Secondary | ICD-10-CM | POA: Insufficient documentation

## 2021-06-24 DIAGNOSIS — Z433 Encounter for attention to colostomy: Secondary | ICD-10-CM | POA: Insufficient documentation

## 2021-06-24 NOTE — Progress Notes (Signed)
[facility]   Reason for visit:  LLQ colostomy, recessed (Photo in chart) Stomal separation present circumferentially HPI:  Perforated sigmoid colon ROS  Review of Systems  Gastrointestinal:        LLQ colostomy  Skin:        Stomal separation with peristomal skin breakdown Photo in chart  Psychiatric/Behavioral: Negative.    All other systems reviewed and are negative. Vital signs:  BP 122/76    Pulse (!) 102    Temp 98.7 F (37.1 C) (Oral)    Resp 18    SpO2 95%  Exam:  Physical Exam Abdominal:     Palpations: Abdomen is soft.     Comments: LLQ colostomy with peristomal skin breakdown  Skin:        Stoma type/location:  LLQ colostomy Stomal assessment/size:  1 1/4" recessed with stomal separation Peristomal assessment:  Denuded skin present circumferentially.  See photo Stomal separation with fibrin to nonintact peristomal skinwill cover with barrier ring.  Treatment options for stomal/peristomal skin: Stoma powder and skin prep to denuded peristomal skin.  Barrier ring and switch to 1piece convex pouch.  DOes now want a belt at this time.  Output: soft brown stool Ostomy pouching: 1pc.convex Education provided:  Pouch change performed.  Demonstrated crusting and protecting skin. Use of barrier ring and cutting barrier opening to appropriate size.      Impression/dx  Contact dermatitis Recessed stoma Discussion  See back in one week to recheck dermatitis and new pouch Plan  Samples provided.     Visit time: 55 minutes.   Maple Hudson FNP-BC

## 2021-06-24 NOTE — Discharge Instructions (Signed)
Switch to 1 piece convex with stoma powder and skin prep plus barrier ring We may add belt later Need to enroll with [employer] patient assistance   call [phone]  Speak with Tobey Grim have stomal separation Skin irritation to skin around stoma

## 2021-06-26 ENCOUNTER — Encounter: Payer: Self-pay | Admitting: Surgery

## 2021-06-27 ENCOUNTER — Ambulatory Visit (HOSPITAL_COMMUNITY)
Admission: RE | Admit: 2021-06-27 | Discharge: 2021-06-27 | Disposition: A | Discharge status: Home / Self Care | Payer: Self-pay | Source: Ambulatory Visit | Attending: Nurse Practitioner | Admitting: Nurse Practitioner

## 2021-06-27 ENCOUNTER — Other Ambulatory Visit: Payer: Self-pay

## 2021-06-27 DIAGNOSIS — L259 Unspecified contact dermatitis, unspecified cause: Secondary | ICD-10-CM | POA: Insufficient documentation

## 2021-06-27 DIAGNOSIS — K9400 Colostomy complication, unspecified: Secondary | ICD-10-CM

## 2021-06-27 DIAGNOSIS — R109 Unspecified abdominal pain: Secondary | ICD-10-CM | POA: Insufficient documentation

## 2021-06-27 DIAGNOSIS — L24B1 Irritant contact dermatitis related to digestive stoma or fistula: Secondary | ICD-10-CM

## 2021-06-27 DIAGNOSIS — R21 Rash and other nonspecific skin eruption: Secondary | ICD-10-CM | POA: Insufficient documentation

## 2021-06-27 DIAGNOSIS — Z433 Encounter for attention to colostomy: Secondary | ICD-10-CM | POA: Insufficient documentation

## 2021-06-27 DIAGNOSIS — Y833 Surgical operation with formation of external stoma as the cause of abnormal reaction of the patient, or of later complication, without mention of misadventure at the time of the procedure: Secondary | ICD-10-CM | POA: Insufficient documentation

## 2021-06-27 DIAGNOSIS — S3092XA Unspecified superficial injury of abdominal wall, initial encounter: Secondary | ICD-10-CM | POA: Insufficient documentation

## 2021-06-27 NOTE — Discharge Instructions (Signed)
Switching back to 2 piece Ostomy paste  Barrier ring Barrier strips to peristomal area.

## 2021-06-27 NOTE — Progress Notes (Signed)
[facility]   Reason for visit:  Ongoing dermatitis and leaking pouch.  Does not think the 1 piece pouch improved her fit.  We will switch back to 2 piece and she wants to try paste to denuded skin instead of skin prep and powder.  HPI:  Perforated sigmoid colon.  Resulting recessed stoma with stomal separation.  Full thickness peristomal breakdown. ROS  Review of Systems  Gastrointestinal:  Positive for abdominal pain.       LLQ colostomy Midline abdominal wound.  Contact dermatitis to peristomal skin Discomfort to abdomen from peristomal breakdown and abdominal wound.   Skin:  Positive for rash.       Denuded skin  All other systems reviewed and are negative. Vital signs:  BP (!) 146/84 (BP Location: Right Arm)    Pulse 99    Temp 98.5 F (36.9 C) (Oral)    Resp 18    SpO2 100%  Exam:  Physical Exam Vitals reviewed.  Abdominal:     Palpations: Abdomen is soft.     Tenderness: There is abdominal tenderness.  Skin:    Findings: Rash present.  Neurological:     Mental Status: She is alert.  Psychiatric:        Mood and Affect: Mood normal.        Behavior: Behavior normal.        Judgment: Judgment normal.    Stoma type/location:  LLQ colostomy Stomal assessment/size:  1 1/4" round stoma with oval opening, due to separation.   Peristomal assessment:  separation circumferentially with slough noted.  Deep creasing at 9 o'clock and minimal creasing at 3 o'clock Treatment options for stomal/peristomal skin: Stomal paste to denuded skin (patient was recommended this by a friend) Barrier ring to peristomal skin.   Output: soft brown stool Ostomy pouching: 2pc. 2 1/4" pouch with paste and barrier ring   I am sending home with a belt, when she feels comfortable applying this  Education provided:  we perform a pouch change.  I apply paste to peristomal skin and in creasing.  I apply a barrier ring to peristomal separation We apply a  2 piece pouch.    Impression/dx   Contact dermatitis Colostomy complication Stomal separation Discussion  BAck to 2 piece Plan  We added barrier strips to pouching today.  I sent her home with a box of these.  She has not enrolled in secure start patient assistance program.  I have encouraged her to start this.  They will not cover the strips, she is aware.       Visit time: 45 minutes.   Domenic Moras FNP-BC

## 2021-06-27 NOTE — Progress Notes (Signed)
°  Progress Note   Date: 06/27/2021  Patient Name: Misty Peck        MRN#: [id number]   The diagnosis of sepsis    sepsis is clinically supported by the following:   June 26, 2021  Patient: Misty Peck  MRN: [id number]  Date of Birth: 1975/06/08  Date of Visit: 06/26/2021    06/26/2021  I was requested by Dr. Algis Liming and Ms. Luther Hearing, RN, to review this patient's medical record and make a judgement about whether a diagnosis of sepsis was appropriate.  Based on the initial symptoms and findings on admission from the ER, a diagnosis of sepsis is appropriate.  These symptoms included an elevated temperature of 101.3 degrees, tachycardia of 118 - 140 / minute, tachypnea of 16 - 27 breaths / minute, and leukocytosis of 23.6K.  Therefore, a diagnosis of sepsis is ruled in.  Please let me know if I may be of further assistance.  Armandina Gemma, MD [facility] [place] [facility] Office: [phone]

## 2021-06-30 NOTE — Progress Notes (Incomplete)
[facility]   Reason for visit:  *** HPI:  *** ROS  Review of Systems Vital signs:  BP (!) 146/84 (BP Location: Right Arm)    Pulse 99    Temp 98.5 F (36.9 C) (Oral)    Resp 18    SpO2 100%  Exam:  Physical Exam  Stoma type/location:  *** Stomal assessment/size:  *** Peristomal assessment:  *** Treatment options for stomal/peristomal skin: *** Output: *** Ostomy pouching: 1pc./2pc.  Education provided:  ***    Impression/dx  *** Discussion  *** Plan  ***    Visit time: *** minutes.   Maple Hudson FNP-BC

## 2021-07-01 ENCOUNTER — Ambulatory Visit (HOSPITAL_COMMUNITY)
Admission: RE | Admit: 2021-07-01 | Discharge: 2021-07-01 | Disposition: A | Discharge status: Home / Self Care | Payer: Self-pay | Source: Ambulatory Visit | Attending: Nurse Practitioner | Admitting: Nurse Practitioner

## 2021-07-01 DIAGNOSIS — K9400 Colostomy complication, unspecified: Secondary | ICD-10-CM

## 2021-07-01 DIAGNOSIS — Z933 Colostomy status: Secondary | ICD-10-CM | POA: Insufficient documentation

## 2021-07-01 NOTE — Discharge Instructions (Signed)
Wear ostomy belt as much as possible except in shower.  Enroll with patient assistance Inquire about medicaid through social services.

## 2021-07-01 NOTE — Progress Notes (Signed)
[facility]   Reason for visit:  LLQ colostomy with ongoing dermatitis, improved today.  Recessed stoma with stomal separation circumferentially.  HPI:  Perforated sigmoid colon with LLQ colostomy, recessed stoma, leaking pouch.  Uninsured with limited resources for supplies.  ROS  Review of Systems  Gastrointestinal:        Open midline abdominal wound- daily wound care.  LLQ retracted colostomy with stomal separation   Psychiatric/Behavioral: Negative.    All other systems reviewed and are negative. Vital signs:  BP (!) 144/84    Pulse 90    Temp 98.9 F (37.2 C) (Oral)    Resp 18    SpO2 96%  Exam:  Physical Exam Vitals reviewed.  Abdominal:     Palpations: Abdomen is soft.  Skin:    General: Skin is warm and dry.     Comments: Peristomal breakdown, improving Peristomal creasing at 3 and 9 o'clock.   Neurological:     Mental Status: She is alert and oriented to person, place, and time.  Psychiatric:        Mood and Affect: Mood normal.        Behavior: Behavior normal.        Thought Content: Thought content normal.        Judgment: Judgment normal.    Stoma type/location:  LLQ colostomy Stomal assessment/size:  1" recessed stoma Peristomal assessment:  stomal separation present, 1 cm with slough to peristomal tissue Treatment options for stomal/peristomal skin: barrier ring and 2 piece pouch. Due to ongoing leaks, will try ostomy belt today.  Output: soft brown stool, oozes out of recessed stoma and sits on separation Ostomy pouching: 2pc. With paste to peristomal skin and creasing at 3 and 9 o'clock  barrier ring to separation  added belt today.   Education provided:  Discussed belt and how it may improve seal as it holds where creasing is present  Does not want to try powder and skin prep.  Prefers paste.     Impression/dx  Contact dermatitis Recessed stoma Stomal separation Discussion  Added ostomy belt Encouraged to call patient assistance with  [employer] to set up for charity supplies.  I encouraged her to call social services to inquire about medicaid.  Plan  Call next week and let me know how belt is working.     Visit time: 45 minutes.   Maple Hudson FNP-BC

## 2021-07-02 NOTE — Progress Notes (Incomplete)
[facility]   Reason for visit:  *** HPI:  *** ROS  Review of Systems Vital signs:  BP (!) 144/84    Pulse 90    Temp 98.9 F (37.2 C) (Oral)    Resp 18    SpO2 96%  Exam:  Physical Exam  Stoma type/location:  *** Stomal assessment/size:  *** Peristomal assessment:  *** Treatment options for stomal/peristomal skin: *** Output: *** Ostomy pouching: 1pc./2pc.  Education provided:  ***    Impression/dx  *** Discussion  *** Plan  ***    Visit time: *** minutes.   Maple Hudson FNP-BC

## 2022-04-26 ENCOUNTER — Ambulatory Visit: Payer: Self-pay

## 2022-05-10 ENCOUNTER — Inpatient Hospital Stay: Payer: Self-pay | Attending: Hematology and Oncology | Admitting: Hematology and Oncology

## 2022-05-10 VITALS — BP 178/119 | Wt 184.0 lb

## 2022-05-10 DIAGNOSIS — Z1231 Encounter for screening mammogram for malignant neoplasm of breast: Secondary | ICD-10-CM

## 2022-05-10 DIAGNOSIS — R921 Mammographic calcification found on diagnostic imaging of breast: Secondary | ICD-10-CM

## 2022-05-10 NOTE — Patient Instructions (Signed)
Taught Misty Peck about self breast awareness and gave educational materials to take home. Patient did not need a Pap smear today due to last Pap smear was in 2020 per patient. Next Pap is due 2025. Let her know [facility] will cover Pap smears every 5 years unless has a history of abnormal Pap smears. [facility]. Let patient know will follow up with her within the next couple weeks with results. Misty Peck verbalized understanding.  [name], NP 9:19 AM

## 2022-05-10 NOTE — Progress Notes (Signed)
Misty Peck is a 46 y.o. female who presents to [facility] clinic today with no complaints.    Pap Smear: Pap not smear completed today. Last Pap smear was 2020 at [facility] and was normal. Per patient has no history of an abnormal Pap smear. Last Pap smear result is available in Epic.   Physical exam: Breasts Breasts symmetrical. No skin abnormalities bilateral breasts. No nipple retraction bilateral breasts. No nipple discharge bilateral breasts. No lymphadenopathy. No lumps palpated bilateral breasts.        Pelvic/Bimanual Pap is not indicated today    Smoking History: Patient has is a current smoker at 1  packs per day and was referred to quit line.    Patient Navigation: Patient education provided. Access to services provided for patient through [facility] program. No interpreter provided. No transportation provided   Colorectal Cancer Screening: Per patient has never had colonoscopy completed No complaints today. Partial colon resection due to perforation secondary to foreign object in January 2023. She is being followed by GI for management.    Breast and Cervical Cancer Risk Assessment: Patient does not have family history of breast cancer, known genetic mutations, or radiation treatment to the chest before age 19. Patient does not have history of cervical dysplasia, immunocompromised, or DES exposure in-utero.  Risk Assessment   No risk assessment data     A: [facility] exam without pap smear No complaints with benign exam. Left breast calcifications noted on screening mammogram; recommend diagnostic mammogram with ultrasound.   P: [facility].  [name], NP 05/10/2022 9:18 AM

## 2022-05-19 ENCOUNTER — Encounter: Payer: Self-pay | Admitting: Hematology and Oncology

## 2022-12-29 IMAGING — CT CT ABD-PELV W/ CM
2 of 5 series · 15 of 46 positions shown, 17 images · IV contrast (Omnipaque)
Comparison: None.

CLINICAL DATA: Bilateral lower quadrant pain and fever. Patient
reports right-sided pelvic pain since [REDACTED].

EXAM:
CT ABDOMEN AND PELVIS WITH CONTRAST
TECHNIQUE: Multidetector CT imaging of the abdomen and pelvis was performed
using the standard protocol following bolus administration of
intravenous contrast.

[Series 2: axial st · axial · 0.98mm/px · z∈[-150,+250]mm · 12 of 90 slices shown, 14 images]
[im 5/90  soft-tissue]
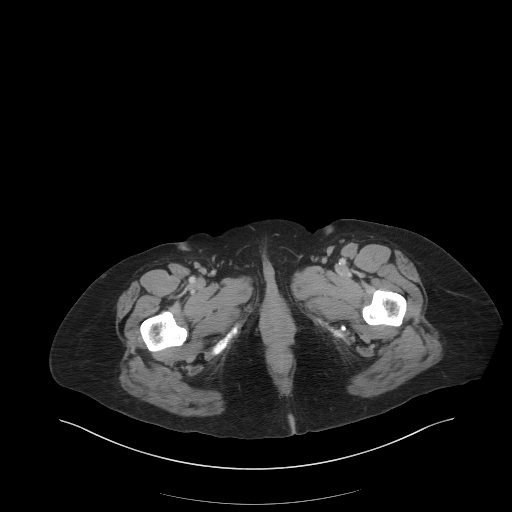
[im 5/90  bone]
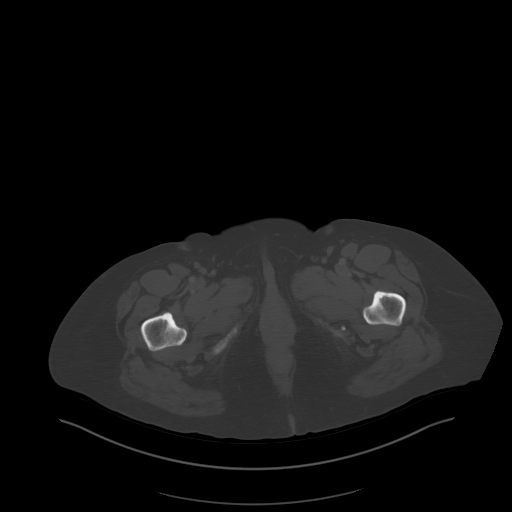
[im 14/90  soft-tissue]
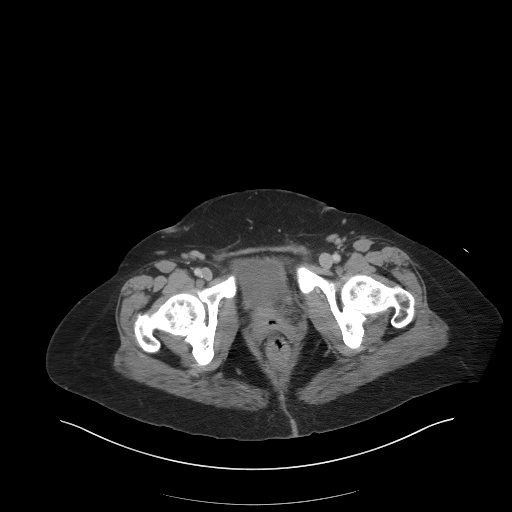
[im 18/90  soft-tissue]
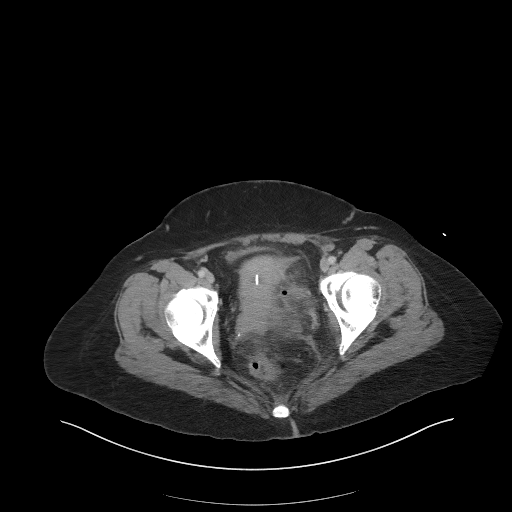
[im 27/90  soft-tissue]
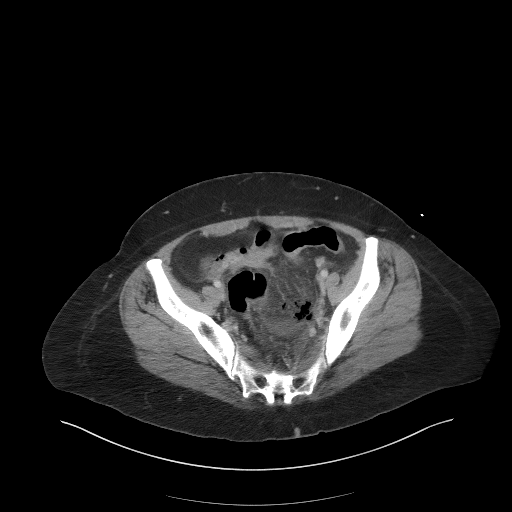
[im 36/90  soft-tissue]
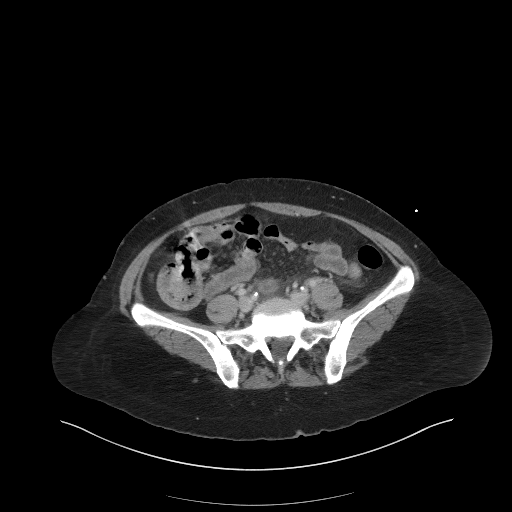
[im 41/90  soft-tissue]
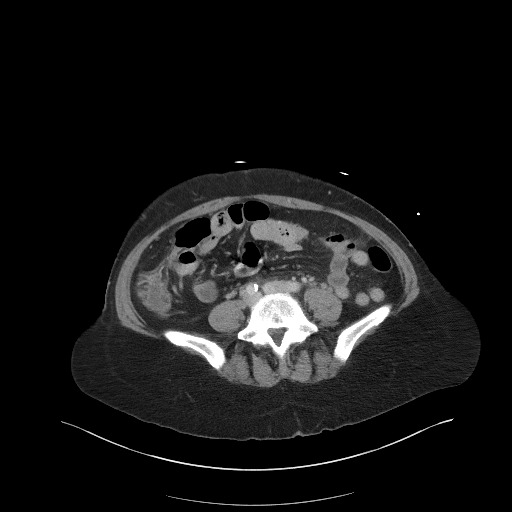
[im 49/90  soft-tissue]
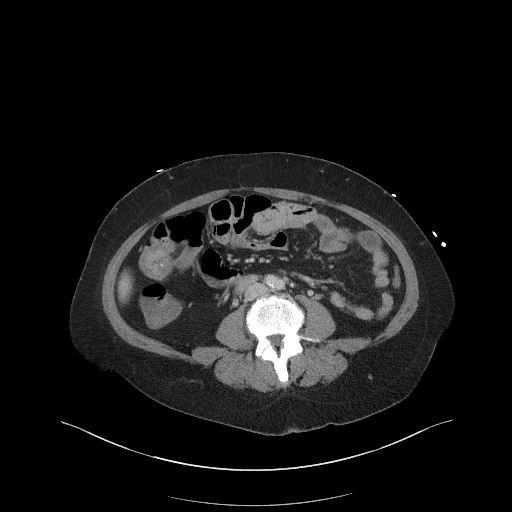
[im 54/90  soft-tissue]
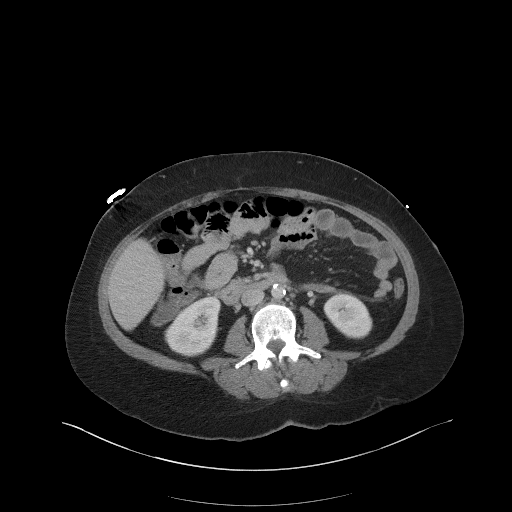
[im 63/90  soft-tissue]
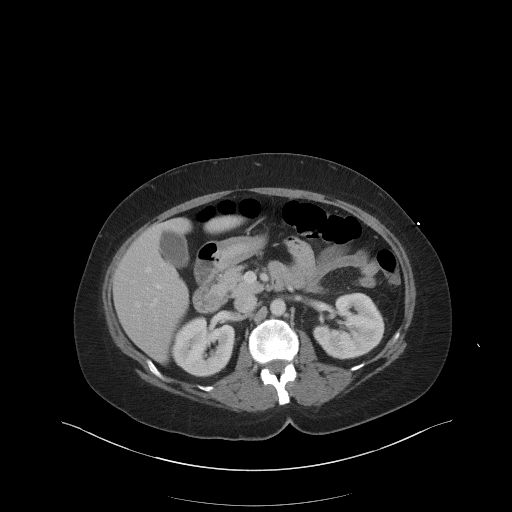
[im 63/90  bone]
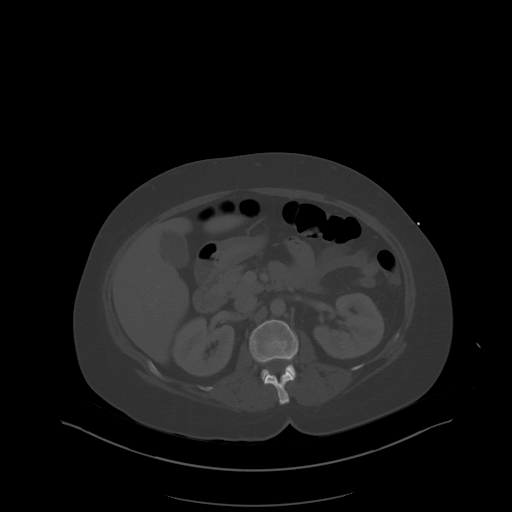
[im 72/90  soft-tissue]
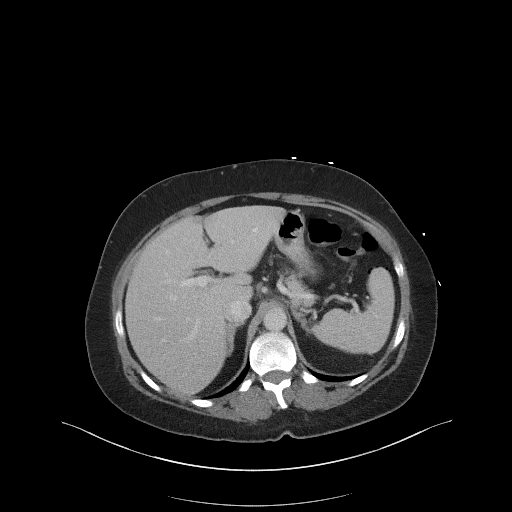
[im 76/90  soft-tissue]
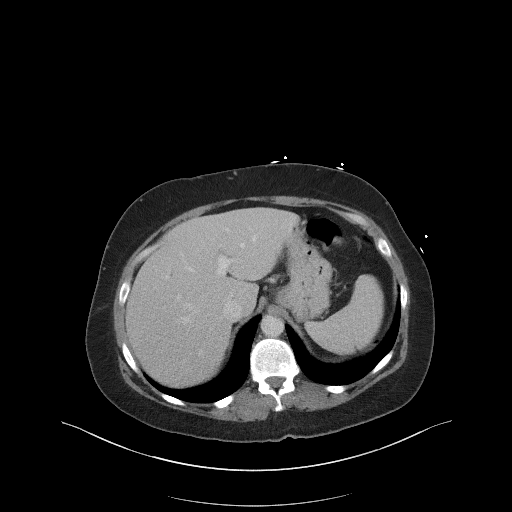
[im 85/90  soft-tissue]
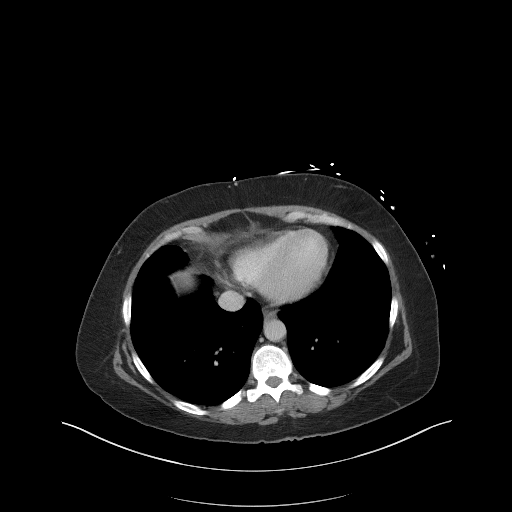

[Series 5: coronal st · coronal · 0.72mm/px · 3 of 101 slices shown]
[im 34/101  soft-tissue]
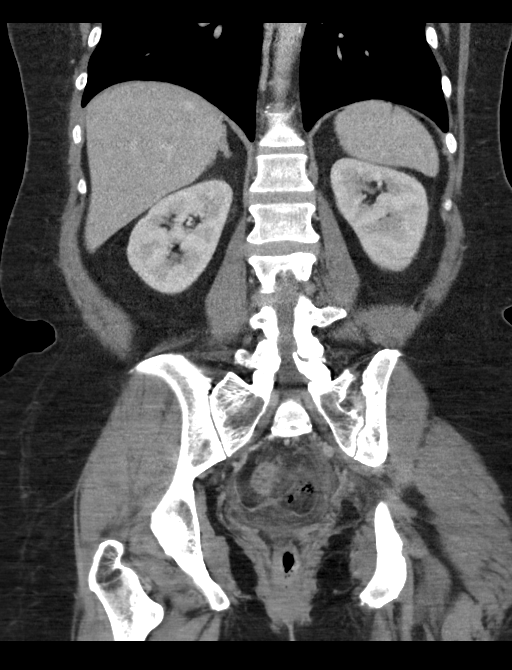
[im 45/101  soft-tissue]
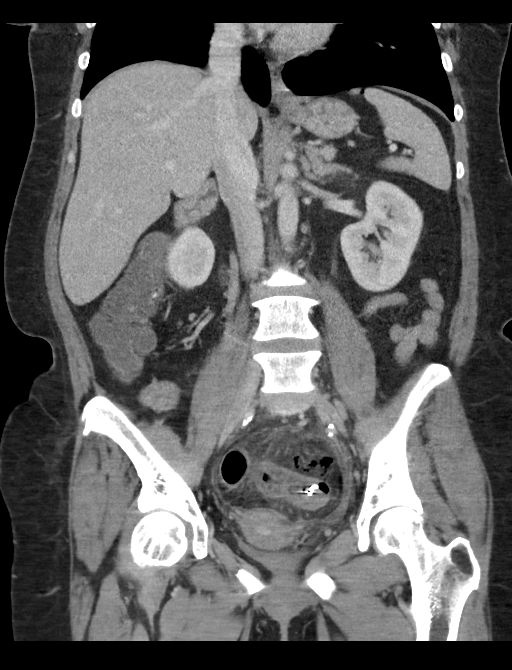
[im 56/101  soft-tissue]
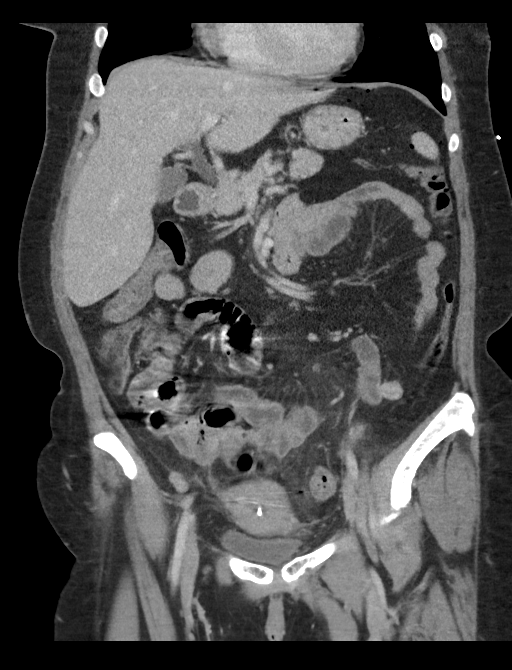

[15 of 46 positions shown; findings below may reference images not displayed]

RADIATION DOSE REDUCTION: This exam was performed according to the
departmental dose-optimization program which includes automated
exposure control, adjustment of the mA and/or kV according to
patient size and/or use of iterative reconstruction technique.

CONTRAST:  100mL OMNIPAQUE IOHEXOL 300 MG/ML  SOLN
FINDINGS: Lower chest: No acute airspace disease or pleural effusion.

Hepatobiliary: No focal liver abnormality is seen. Possible but not
definite gallstone. No pericholecystic fat stranding or
inflammation. No biliary dilatation.

Pancreas: No ductal dilatation or inflammation.

Spleen: Normal in size without focal abnormality. Small cleft
posteriorly.

Adrenals/Urinary Tract: No adrenal nodule. No hydronephrosis or
perinephric edema. Homogeneous renal enhancement with symmetric
excretion on delayed phase imaging. No renal calculi or focal renal
lesion. Urinary bladder is near completely empty.

Stomach/Bowel: There is a linear metallic density in the sigmoid
colon. This spans approximately 2.9 cm and is hooked at the end.
There is associated sigmoid bowel perforation with mottled
extraluminal air, colonic wall thickening, and fat stranding. Small
amount of non organized trace fluid. This is in the pelvis just to
the left of midline. Free air tracks into the left upper quadrant.
No drainable fluid collection. The appendix is visualized and is
normal. Small volume of colonic stool. Occasional fluid-filled loops
of small bowel, mildly edematous in the pelvis, likely reactive. No
small bowel obstruction. Stomach is decompressed.

Vascular/Lymphatic: Aortic atherosclerosis. No aortic aneurysm.
There is no portal venous or mesenteric gas. Scattered prominent
mesenteric lymph nodes, typically reactive. Few prominent inguinal
nodes.

Reproductive: T-shaped IUD in the uterus.  No adnexal mass.

Other: Mild free air in the pelvis with small amount of non
organized free fluid. Patchy soft tissue gas tracks into the left
upper abdomen. No drainable collection.

Musculoskeletal: There are no acute or suspicious osseous
abnormalities.
IMPRESSION: 1. Linear metallic density in the sigmoid colon spanning
approximately 2.9 cm and hooked at the end, consistent with foreign
body. There is associated sigmoid bowel perforation with mottled
extraluminal air, colonic wall thickening, none organized trace
fluid and fat stranding. Free air tracks into the left upper
quadrant of the abdomen. No drainable fluid collection.
2. Possible but not definite gallstone. No evidence of acute
cholecystitis.

Aortic Atherosclerosis ([id number]).

Critical Value/emergent results were called by telephone at the time
of interpretation on 06/13/2021 at [DATE] to provider REYNEL JEFERSON
, who verbally acknowledged these results.
